# Patient Record
Sex: Male | Born: 1970 | Race: Black or African American | Hispanic: No | Smoking: Former smoker
Health system: Southern US, Community
[De-identification: ages and names within clinical notes are randomized; demographics above are authoritative.]

## PROBLEM LIST (undated history)

## (undated) DIAGNOSIS — I714 Abdominal aortic aneurysm, without rupture, unspecified: Secondary | ICD-10-CM

## (undated) DIAGNOSIS — G510 Bell's palsy: Secondary | ICD-10-CM

## (undated) DIAGNOSIS — K219 Gastro-esophageal reflux disease without esophagitis: Secondary | ICD-10-CM

## (undated) HISTORY — DX: Abdominal aortic aneurysm, without rupture, unspecified: I71.40

## (undated) HISTORY — DX: Bell's palsy: G51.0

## (undated) HISTORY — DX: Abdominal aortic aneurysm, without rupture: I71.4

---

## 2009-03-31 ENCOUNTER — Emergency Department (HOSPITAL_COMMUNITY): Admission: EM | Admit: 2009-03-31 | Discharge: 2009-04-01 | Payer: Self-pay | Admitting: Emergency Medicine

## 2009-04-08 ENCOUNTER — Emergency Department (HOSPITAL_COMMUNITY): Admission: EM | Admit: 2009-04-08 | Discharge: 2009-04-08 | Payer: Self-pay | Admitting: Emergency Medicine

## 2010-05-15 ENCOUNTER — Emergency Department (HOSPITAL_COMMUNITY): Admission: EM | Admit: 2010-05-15 | Discharge: 2010-05-15 | Payer: Self-pay | Admitting: Emergency Medicine

## 2015-03-30 ENCOUNTER — Ambulatory Visit: Payer: Self-pay | Admitting: Family

## 2015-03-30 DIAGNOSIS — Z0289 Encounter for other administrative examinations: Secondary | ICD-10-CM

## 2015-04-27 ENCOUNTER — Encounter: Payer: Self-pay | Admitting: Family

## 2015-04-27 ENCOUNTER — Telehealth: Payer: Self-pay | Admitting: Family

## 2015-04-27 ENCOUNTER — Other Ambulatory Visit (INDEPENDENT_AMBULATORY_CARE_PROVIDER_SITE_OTHER): Payer: BLUE CROSS/BLUE SHIELD

## 2015-04-27 ENCOUNTER — Ambulatory Visit (INDEPENDENT_AMBULATORY_CARE_PROVIDER_SITE_OTHER): Payer: BLUE CROSS/BLUE SHIELD | Admitting: Family

## 2015-04-27 VITALS — BP 120/84 | HR 75 | Temp 98.4°F | Resp 18 | Ht 72.0 in | Wt 218.8 lb

## 2015-04-27 DIAGNOSIS — Z0000 Encounter for general adult medical examination without abnormal findings: Secondary | ICD-10-CM | POA: Diagnosis not present

## 2015-04-27 DIAGNOSIS — D72819 Decreased white blood cell count, unspecified: Secondary | ICD-10-CM

## 2015-04-27 DIAGNOSIS — Z23 Encounter for immunization: Secondary | ICD-10-CM | POA: Diagnosis not present

## 2015-04-27 LAB — TSH: TSH: 0.7 u[IU]/mL (ref 0.35–4.50)

## 2015-04-27 LAB — COMPREHENSIVE METABOLIC PANEL
ALBUMIN: 4.1 g/dL (ref 3.5–5.2)
ALT: 15 U/L (ref 0–53)
AST: 13 U/L (ref 0–37)
Alkaline Phosphatase: 83 U/L (ref 39–117)
BUN: 11 mg/dL (ref 6–23)
CALCIUM: 9.5 mg/dL (ref 8.4–10.5)
CHLORIDE: 103 meq/L (ref 96–112)
CO2: 27 meq/L (ref 19–32)
Creatinine, Ser: 1.11 mg/dL (ref 0.40–1.50)
GFR: 92.66 mL/min (ref >60.00)
Glucose, Bld: 93 mg/dL (ref 70–99)
POTASSIUM: 3.9 meq/L (ref 3.5–5.1)
Sodium: 138 mEq/L (ref 135–145)
Total Bilirubin: 0.4 mg/dL (ref 0.2–1.2)
Total Protein: 7.5 g/dL (ref 6.0–8.3)

## 2015-04-27 LAB — HIV ANTIBODY (ROUTINE TESTING W REFLEX): HIV: NONREACTIVE

## 2015-04-27 LAB — CBC
HEMATOCRIT: 44.3 % (ref 39.0–52.0)
Hemoglobin: 14.4 g/dL (ref 13.0–17.0)
MCHC: 32.5 g/dL (ref 30.0–36.0)
MCV: 84.5 fl (ref 78.0–100.0)
PLATELETS: 132 10*3/uL — AB (ref 150.0–400.0)
RBC: 5.24 Mil/uL (ref 4.22–5.81)
RDW: 15.7 % — ABNORMAL HIGH (ref 11.5–15.5)
WBC: 3.1 10*3/uL — ABNORMAL LOW (ref 4.0–10.5)

## 2015-04-27 LAB — LIPID PANEL
CHOLESTEROL: 217 mg/dL — AB (ref 0–200)
HDL: 68.3 mg/dL (ref >39.00)
LDL CALC: 123 mg/dL — AB (ref 0–99)
NonHDL: 148.65
TRIGLYCERIDES: 129 mg/dL (ref 0.0–149.0)
Total CHOL/HDL Ratio: 3
VLDL: 25.8 mg/dL (ref 0.0–40.0)

## 2015-04-27 NOTE — Progress Notes (Signed)
Pre visit review using our clinic review tool, if applicable. No additional management support is needed unless otherwise documented below in the visit note.  Refused flu wants tetanus

## 2015-04-27 NOTE — Progress Notes (Signed)
Subjective:    Patient ID: Andre Morris, male    DOB: 05/30/1971, 44 y.o.   MRN: [id number]  Chief Complaint  Patient presents with  . Establish Care    CPE, fasting    HPI:  Andre Morris is a 44 y.o. male who presents today for an annual wellness visit.   1) Health Maintenance -   Diet - Averages about 1-3 meals per day consisting of a variety of vegetables, chicken, and fruit; 1-2 cups of caffeine every few days.   Exercise - No structured exercise.    2) Preventative Exams / Immunizations:  Dental -- Up to date  Vision -- Due for exam   Health Maintenance  Topic Date Due  . HIV Screening  07/31/1986  . TETANUS/TDAP  07/31/1990  . INFLUENZA VACCINE  03/11/2016 (Originally 01/26/2015)    Immunization History  Administered Date(s) Administered  . Td 04/27/2015    No Known Allergies   No outpatient prescriptions prior to visit.   No facility-administered medications prior to visit.     History reviewed. No pertinent past medical history.   History reviewed. No pertinent past surgical history.   Family History  Problem Relation Age of Onset  . Hypertension Mother   . Lung cancer Father   . Lung cancer Maternal Grandmother   . Heart disease Maternal Grandmother   . Hypertension Maternal Grandmother   . Healthy Maternal Grandfather   . Healthy Paternal Grandmother   . Healthy Paternal Grandfather      Social History   Social History  . Marital Status: Single    Spouse Name: N/A  . Number of Children: 4  . Years of Education: 12   Occupational History  . OTR Truck Driver    Social History Main Topics  . Smoking status: Current Every Day Smoker -- 0.50 packs/day for 20 years    Types: Cigarettes  . Smokeless tobacco: Never Used  . Alcohol Use: 0.0 oz/week    0 Standard drinks or equivalent per week     Comment: Occasionanally   . Drug Use: No  . Sexual Activity: Not on file   Other Topics Concern  . Not on file    Social History Narrative   Fun: Go-carts, anything adventurous.    Denies religious beliefs effecting health care.       Review of Systems  Constitutional: Denies fever, chills, fatigue, or significant weight gain/loss. HENT: Head: Denies headache or neck pain Ears: Denies changes in hearing, ringing in ears, earache, drainage Nose: Denies discharge, stuffiness, itching, nosebleed, sinus pain Throat: Denies sore throat, hoarseness, dry mouth, sores, thrush Eyes: Denies loss/changes in vision, pain, redness, blurry/double vision, flashing lights Cardiovascular: Denies chest pain/discomfort, tightness, palpitations, shortness of breath with activity, difficulty lying down, swelling, sudden awakening with shortness of breath Respiratory: Denies shortness of breath, cough, sputum production, wheezing Gastrointestinal: Denies dysphasia, heartburn, change in appetite, nausea, change in bowel habits, rectal bleeding, constipation, diarrhea, yellow skin or eyes Genitourinary: Denies frequency, urgency, burning/pain, blood in urine, incontinence, change in urinary strength. Musculoskeletal: Denies muscle/joint pain, stiffness, back pain, redness or swelling of joints, trauma Skin: Denies rashes, lumps, itching, dryness, color changes, or hair/nail changes Neurological: Denies dizziness, fainting, seizures, weakness, numbness, tingling, tremor Psychiatric - Denies nervousness, stress, depression or memory loss Endocrine: Denies heat or cold intolerance, sweating, frequent urination, excessive thirst, changes in appetite Hematologic: Denies ease of bruising or bleeding     Objective:     BP 120/84  mmHg  Pulse 75  Temp(Src) 98.4 F (36.9 C) (Oral)  Resp 18  Ht 6' (1.829 m)  Wt 218 lb 12.8 oz (99.247 kg)  BMI 29.67 kg/m2  SpO2 98% Nursing note and vital signs reviewed.  Physical Exam  Constitutional: He is oriented to person, place, and time. He appears well-developed and  well-nourished.  HENT:  Head: Normocephalic.  Right Ear: Hearing, tympanic membrane, external ear and ear canal normal.  Left Ear: Hearing, tympanic membrane, external ear and ear canal normal.  Nose: Nose normal.  Mouth/Throat: Uvula is midline, oropharynx is clear and moist and mucous membranes are normal.  Eyes: Conjunctivae and EOM are normal. Pupils are equal, round, and reactive to light.  Neck: Neck supple. No JVD present. No tracheal deviation present. No thyromegaly present.  Cardiovascular: Normal rate, regular rhythm, normal heart sounds and intact distal pulses.   Pulmonary/Chest: Effort normal. He has wheezes.  Abdominal: Soft. Bowel sounds are normal. He exhibits no distension and no mass. There is no tenderness. There is no rebound and no guarding.  Musculoskeletal: Normal range of motion. He exhibits no edema or tenderness.  Lymphadenopathy:    He has no cervical adenopathy.  Neurological: He is alert and oriented to person, place, and time. He has normal reflexes. No cranial nerve deficit. He exhibits normal muscle tone. Coordination normal.  Skin: Skin is warm and dry.  Psychiatric: He has a normal mood and affect. His behavior is normal. Judgment and thought content normal.       Assessment & Plan:   Problem List Items Addressed This Visit      Other   Routine general medical examination at a health care facility - Primary    1) Anticipatory Guidance: Discussed importance of wearing a seatbelt while driving and not texting while driving; changing batteries in smoke detector at least once annually; wearing suntan lotion when outside; eating a balanced and moderate diet; getting physical activity at least 30 minutes per day.  2) Immunizations / Screenings / Labs:  Tetanus updated today. Declines flu shot. All other immunizations are up-to-date per recommendations. Due for a vision screen. This will be scheduled independently. Obtain HIV screening per patient request.  All other screenings are up-to-date per recommendations. Obtain CBC, CMET, Lipid profile and TSH.   Overall well exam with risk factors for cardiovascular disease including overweight, tobacco use, and sedentary lifestyle. Establish weight loss: Approximately 5-10% current body weight through increased physical activity to 30 minutes most days a week and nutrient density of foods and eating a moderate/variety meal plan. Not ready to quit smoking at this time. Smoking cessation information provided for his review. Follow-up prevention exam in 1 year. Follow-up office visit pending blood work.       Relevant Orders   CBC   Comprehensive metabolic panel   TSH   Lipid panel   HIV antibody    Other Visit Diagnoses    Need for prophylactic vaccination with tetanus-diphtheria (TD)        Relevant Orders    Td vaccine greater than or equal to 7yo preservative free IM (Completed)

## 2015-04-27 NOTE — Assessment & Plan Note (Signed)
1) Anticipatory Guidance: Discussed importance of wearing a seatbelt while driving and not texting while driving; changing batteries in smoke detector at least once annually; wearing suntan lotion when outside; eating a balanced and moderate diet; getting physical activity at least 30 minutes per day.  2) Immunizations / Screenings / Labs:  Tetanus updated today. Declines flu shot. All other immunizations are up-to-date per recommendations. Due for a vision screen. This will be scheduled independently. Obtain HIV screening per patient request. All other screenings are up-to-date per recommendations. Obtain CBC, CMET, Lipid profile and TSH.   Overall well exam with risk factors for cardiovascular disease including overweight, tobacco use, and sedentary lifestyle. Establish weight loss: Approximately 5-10% current body weight through increased physical activity to 30 minutes most days a week and nutrient density of foods and eating a moderate/variety meal plan. Not ready to quit smoking at this time. Smoking cessation information provided for his review. Follow-up prevention exam in 1 year. Follow-up office visit pending blood work.

## 2015-04-27 NOTE — Telephone Encounter (Signed)
Please inform patient that his HIV was negative and his cholesterol, thyroid function, kidney function and electrolytes are within the normal limits. His white/red blood cells had a few values that were slightly decreased so I'd like for him to repeat the blood work sometime in the next week as this is likely nothing to be concern about. No fasting is needed for this appointment.

## 2015-04-27 NOTE — Patient Instructions (Signed)
Thank you for choosing [facility].  Summary/Instructions:  Your prescription(s) have been submitted to your pharmacy or been printed and provided for you. Please take as directed and contact our office if you believe you are having problem(s) with the medication(s) or have any questions.  Please stop by the lab on the basement level of the building for your blood work. Your results will be released to [place] (or called to you) after review, usually within 72 hours after test completion. If any changes need to be made, you will be notified at that same time.  Health Maintenance, Male A healthy lifestyle and preventative care can promote health and wellness.  Maintain regular health, dental, and eye exams.  Eat a healthy diet. Foods like vegetables, fruits, whole grains, low-fat dairy products, and lean protein foods contain the nutrients you need and are low in calories. Decrease your intake of foods high in solid fats, added sugars, and salt. Get information about a proper diet from your health care provider, if necessary.  Regular physical exercise is one of the most important things you can do for your health. Most adults should get at least 150 minutes of moderate-intensity exercise (any activity that increases your heart rate and causes you to sweat) each week. In addition, most adults need muscle-strengthening exercises on 2 or more days a week.   Maintain a healthy weight. The body mass index (BMI) is a screening tool to identify possible weight problems. It provides an estimate of body fat based on height and weight. Your health care provider can find your BMI and can help you achieve or maintain a healthy weight. For males 20 years and older:  A BMI below 18.5 is considered underweight.  A BMI of 18.5 to 24.9 is normal.  A BMI of 25 to 29.9 is considered overweight.  A BMI of 30 and above is considered obese.  Maintain normal blood lipids and cholesterol by exercising and  minimizing your intake of saturated fat. Eat a balanced diet with plenty of fruits and vegetables. Blood tests for lipids and cholesterol should begin at age 20 and be repeated every 5 years. If your lipid or cholesterol levels are high, you are over age 39, or you are at high risk for heart disease, you may need your cholesterol levels checked more frequently.Ongoing high lipid and cholesterol levels should be treated with medicines if diet and exercise are not working.  If you smoke, find out from your health care provider how to quit. If you do not use tobacco, do not start.  Lung cancer screening is recommended for adults aged 34-80 years who are at high risk for developing lung cancer because of a history of smoking. A yearly low-dose CT scan of the lungs is recommended for people who have at least a 30-pack-year history of smoking and are current smokers or have quit within the past 15 years. A pack year of smoking is smoking an average of 1 pack of cigarettes a day for 1 year (for example, a 30-pack-year history of smoking could mean smoking 1 pack a day for 30 years or 2 packs a day for 15 years). Yearly screening should continue until the smoker has stopped smoking for at least 15 years. Yearly screening should be stopped for people who develop a health problem that would prevent them from having lung cancer treatment.  If you choose to drink alcohol, do not have more than 2 drinks per day. One drink is considered to be  12 oz (360 mL) of beer, 5 oz (150 mL) of wine, or 1.5 oz (45 mL) of liquor.  Avoid the use of street drugs. Do not share needles with anyone. Ask for help if you need support or instructions about stopping the use of drugs.  High blood pressure causes heart disease and increases the risk of stroke. High blood pressure is more likely to develop in:  People who have blood pressure in the end of the normal range (100-139/85-89 mm Hg).  People who are overweight or  obese.  People who are African American.  If you are 51-40 years of age, have your blood pressure checked every 3-5 years. If you are 77 years of age or older, have your blood pressure checked every year. You should have your blood pressure measured twice--once when you are at a hospital or clinic, and once when you are not at a hospital or clinic. Record the average of the two measurements. To check your blood pressure when you are not at a hospital or clinic, you can use:  An automated blood pressure machine at a pharmacy.  A home blood pressure monitor.  If you are 35-45 years old, ask your health care provider if you should take aspirin to prevent heart disease.  Diabetes screening involves taking a blood sample to check your fasting blood sugar level. This should be done once every 3 years after age 90 if you are at a normal weight and without risk factors for diabetes. Testing should be considered at a younger age or be carried out more frequently if you are overweight and have at least 1 risk factor for diabetes.  Colorectal cancer can be detected and often prevented. Most routine colorectal cancer screening begins at the age of 57 and continues through age 72. However, your health care provider may recommend screening at an earlier age if you have risk factors for colon cancer. On a yearly basis, your health care provider may provide home test kits to check for hidden blood in the stool. A small camera at the end of a tube may be used to directly examine the colon (sigmoidoscopy or colonoscopy) to detect the earliest forms of colorectal cancer. Talk to your health care provider about this at age 54 when routine screening begins. A direct exam of the colon should be repeated every 5-10 years through age 4, unless early forms of precancerous polyps or small growths are found.  People who are at an increased risk for hepatitis B should be screened for this virus. You are considered at high risk  for hepatitis B if:  You were born in a country where hepatitis B occurs often. Talk with your health care provider about which countries are considered high risk.  Your parents were born in a high-risk country and you have not received a shot to protect against hepatitis B (hepatitis B vaccine).  You have HIV or AIDS.  You use needles to inject street drugs.  You live with, or have sex with, someone who has hepatitis B.  You are a man who has sex with other men (MSM).  You get hemodialysis treatment.  You take certain medicines for conditions like cancer, organ transplantation, and autoimmune conditions.  Hepatitis C blood testing is recommended for all people born from [birth year] through 1964 and any individual with known risk factors for hepatitis C.  Healthy men should no longer receive prostate-specific antigen (PSA) blood tests as part of routine cancer screening. Talk to your  health care provider about prostate cancer screening.  Testicular cancer screening is not recommended for adolescents or adult males who have no symptoms. Screening includes self-exam, a health care provider exam, and other screening tests. Consult with your health care provider about any symptoms you have or any concerns you have about testicular cancer.  Practice safe sex. Use condoms and avoid high-risk sexual practices to reduce the spread of sexually transmitted infections (STIs).  You should be screened for STIs, including gonorrhea and chlamydia if:  You are sexually active and are younger than 24 years.  You are older than 24 years, and your health care provider tells you that you are at risk for this type of infection.  Your sexual activity has changed since you were last screened, and you are at an increased risk for chlamydia or gonorrhea. Ask your health care provider if you are at risk.  If you are at risk of being infected with HIV, it is recommended that you take a prescription medicine daily to  prevent HIV infection. This is called pre-exposure prophylaxis (PrEP). You are considered at risk if:  You are a man who has sex with other men (MSM).  You are a heterosexual man who is sexually active with multiple partners.  You take drugs by injection.  You are sexually active with a partner who has HIV.  Talk with your health care provider about whether you are at high risk of being infected with HIV. If you choose to begin PrEP, you should first be tested for HIV. You should then be tested every 3 months for as long as you are taking PrEP.  Use sunscreen. Apply sunscreen liberally and repeatedly throughout the day. You should seek shade when your shadow is shorter than you. Protect yourself by wearing long sleeves, pants, a wide-brimmed hat, and sunglasses year round whenever you are outdoors.  Tell your health care provider of new moles or changes in moles, especially if there is a change in shape or color. Also, tell your health care provider if a mole is larger than the size of a pencil eraser.  A one-time screening for abdominal aortic aneurysm (AAA) and surgical repair of large AAAs by ultrasound is recommended for men aged 65-75 years who are current or former smokers.  Stay current with your vaccines (immunizations).   This information is not intended to replace advice given to you by your health care provider. Make sure you discuss any questions you have with your health care provider.   Document Released: 12/10/2007 Document Revised: 07/04/2014 Document Reviewed: 11/08/2010 Elsevier Interactive Patient Education 2016 [employer]  Smoking Cessation, Tips for Success If you are ready to quit smoking, congratulations! You have chosen to help yourself be healthier. Cigarettes bring nicotine, tar, carbon monoxide, and other irritants into your body. Your lungs, heart, and blood vessels will be able to work better without these poisons. There are many different ways to quit  smoking. Nicotine gum, nicotine patches, a nicotine inhaler, or nicotine nasal spray can help with physical craving. Hypnosis, support groups, and medicines help break the habit of smoking. WHAT THINGS CAN I DO TO MAKE QUITTING EASIER?  Here are some tips to help you quit for good:  Pick a date when you will quit smoking completely. Tell all of your friends and family about your plan to quit on that date.  Do not try to slowly cut down on the number of cigarettes you are smoking. Pick a quit date and quit  smoking completely starting on that day.  Throw away all cigarettes.   Clean and remove all ashtrays from your home, work, and car.  On a card, write down your reasons for quitting. Carry the card with you and read it when you get the urge to smoke.  Cleanse your body of nicotine. Drink enough water and fluids to keep your urine clear or pale yellow. Do this after quitting to flush the nicotine from your body.  Learn to predict your moods. Do not let a bad situation be your excuse to have a cigarette. Some situations in your life might tempt you into wanting a cigarette.  Never have "just one" cigarette. It leads to wanting another and another. Remind yourself of your decision to quit.  Change habits associated with smoking. If you smoked while driving or when feeling stressed, try other activities to replace smoking. Stand up when drinking your coffee. Brush your teeth after eating. Sit in a different chair when you read the paper. Avoid alcohol while trying to quit, and try to drink fewer caffeinated beverages. Alcohol and caffeine may urge you to smoke.  Avoid foods and drinks that can trigger a desire to smoke, such as sugary or spicy foods and alcohol.  Ask people who smoke not to smoke around you.  Have something planned to do right after eating or having a cup of coffee. For example, plan to take a walk or exercise.  Try a relaxation exercise to calm you down and decrease your  stress. Remember, you may be tense and nervous for the first 2 weeks after you quit, but this will pass.  Find new activities to keep your hands busy. Play with a pen, coin, or rubber band. Doodle or draw things on paper.  Brush your teeth right after eating. This will help cut down on the craving for the taste of tobacco after meals. You can also try mouthwash.   Use oral substitutes in place of cigarettes. Try using lemon drops, carrots, cinnamon sticks, or chewing gum. Keep them handy so they are available when you have the urge to smoke.  When you have the urge to smoke, try deep breathing.  Designate your home as a nonsmoking area.  If you are a heavy smoker, ask your health care provider about a prescription for nicotine chewing gum. It can ease your withdrawal from nicotine.  Reward yourself. Set aside the cigarette money you save and buy yourself something nice.  Look for support from others. Join a support group or smoking cessation program. Ask someone at home or at work to help you with your plan to quit smoking.  Always ask yourself, "Do I need this cigarette or is this just a reflex?" Tell yourself, "Today, I choose not to smoke," or "I do not want to smoke." You are reminding yourself of your decision to quit.  Do not replace cigarette smoking with electronic cigarettes (commonly called e-cigarettes). The safety of e-cigarettes is unknown, and some may contain harmful chemicals.  If you relapse, do not give up! Plan ahead and think about what you will do the next time you get the urge to smoke. HOW WILL I FEEL WHEN I QUIT SMOKING? You may have symptoms of withdrawal because your body is used to nicotine (the addictive substance in cigarettes). You may crave cigarettes, be irritable, feel very hungry, cough often, get headaches, or have difficulty concentrating. The withdrawal symptoms are only temporary. They are strongest when you first quit but will  go away within 10-14 days.  When withdrawal symptoms occur, stay in control. Think about your reasons for quitting. Remind yourself that these are signs that your body is healing and getting used to being without cigarettes. Remember that withdrawal symptoms are easier to treat than the major diseases that smoking can cause.  Even after the withdrawal is over, expect periodic urges to smoke. However, these cravings are generally short lived and will go away whether you smoke or not. Do not smoke! WHAT RESOURCES ARE AVAILABLE TO HELP ME QUIT SMOKING? Your health care provider can direct you to community resources or hospitals for support, which may include:  Group support.  Education.  Hypnosis.  Therapy.   This information is not intended to replace advice given to you by your health care provider. Make sure you discuss any questions you have with your health care provider.   Document Released: 03/11/2004 Document Revised: 07/04/2014 Document Reviewed: 11/29/2012 Elsevier Interactive Patient Education 2016 [employer].   Bupropion sustained-release tablets (smoking cessation) What is this medicine? BUPROPION (byoo PROE pee on) is used to help people quit smoking. This medicine may be used for other purposes; ask your health care provider or pharmacist if you have questions. What should I tell my health care provider before I take this medicine? They need to know if you have any of these conditions: -an eating disorder, such as anorexia or bulimia -bipolar disorder or psychosis -diabetes or high blood sugar, treated with medication -glaucoma -head injury or brain tumor -heart disease, previous heart attack, or irregular heart beat -high blood pressure -kidney or liver disease -seizures -suicidal thoughts or a previous suicide attempt -Tourette's syndrome -weight loss -an unusual or allergic reaction to bupropion, other medicines, foods, dyes, or preservatives -breast-feeding -pregnant or trying to become  pregnant How should I use this medicine? Take this medicine by mouth with a glass of water. Follow the directions on the prescription label. You can take it with or without food. If it upsets your stomach, take it with food. Do not cut, crush or chew this medicine. Take your medicine at regular intervals. If you take this medicine more than once a day, take your second dose at least 8 hours after you take your first dose. To limit difficulty in sleeping, avoid taking this medicine at bedtime. Do not take your medicine more often than directed. Do not stop taking this medicine suddenly except upon the advice of your doctor. Stopping this medicine too quickly may cause serious side effects. A special MedGuide will be given to you by the pharmacist with each prescription and refill. Be sure to read this information carefully each time. Talk to your pediatrician regarding the use of this medicine in children. Special care may be needed. Overdosage: If you think you have taken too much of this medicine contact a poison control center or emergency room at once. NOTE: This medicine is only for you. Do not share this medicine with others. What if I miss a dose? If you miss a dose, skip the missed dose and take your next tablet at the regular time. There should be at least 8 hours between doses. Do not take double or extra doses. What may interact with this medicine? Do not take this medicine with any of the following medications: -linezolid -MAOIs like Azilect, Carbex, Eldepryl, Marplan, Nardil, and Parnate -methylene blue (injected into a vein) -other medicines that contain bupropion like Wellbutrin This medicine may also interact with the following medications: -alcohol -certain  medicines for anxiety or sleep -certain medicines for blood pressure like metoprolol, propranolol -certain medicines for depression or psychotic disturbances -certain medicines for HIV or AIDS like efavirenz, lopinavir,  nelfinavir, ritonavir -certain medicines for irregular heart beat like propafenone, flecainide -certain medicines for Parkinson's disease like amantadine, levodopa -certain medicines for seizures like carbamazepine, phenytoin, phenobarbital -cimetidine -clopidogrel -cyclophosphamide -furazolidone -isoniazid -nicotine -orphenadrine -procarbazine -steroid medicines like prednisone or cortisone -stimulant medicines for attention disorders, weight loss, or to stay awake -tamoxifen -theophylline -thiotepa -ticlopidine -tramadol -warfarin This list may not describe all possible interactions. Give your health care provider a list of all the medicines, herbs, non-prescription drugs, or dietary supplements you use. Also tell them if you smoke, drink alcohol, or use illegal drugs. Some items may interact with your medicine. What should I watch for while using this medicine? Visit your doctor or health care professional for regular checks on your progress. This medicine should be used together with a patient support program. It is important to participate in a behavioral program, counseling, or other support program that is recommended by your health care professional. Patients and their families should watch out for new or worsening thoughts of suicide or depression. Also watch out for sudden changes in feelings such as feeling anxious, agitated, panicky, irritable, hostile, aggressive, impulsive, severely restless, overly excited and hyperactive, or not being able to sleep. If this happens, especially at the beginning of treatment or after a change in dose, call your health care professional. Avoid alcoholic drinks while taking this medicine. Drinking excessive alcoholic beverages, using sleeping or anxiety medicines, or quickly stopping the use of these agents while taking this medicine may increase your risk for a seizure. Do not drive or use heavy machinery until you know how this medicine affects  you. This medicine can impair your ability to perform these tasks. Do not take this medicine close to bedtime. It may prevent you from sleeping. Your mouth may get dry. Chewing sugarless gum or sucking hard candy, and drinking plenty of water may help. Contact your doctor if the problem does not go away or is severe. Do not use nicotine patches or chewing gum without the advice of your doctor or health care professional while taking this medicine. You may need to have your blood pressure taken regularly if your doctor recommends that you use both nicotine and this medicine together. What side effects may I notice from receiving this medicine? Side effects that you should report to your doctor or health care professional as soon as possible: -allergic reactions like skin rash, itching or hives, swelling of the face, lips, or tongue -breathing problems -changes in vision -confusion -fast or irregular heartbeat -hallucinations -increased blood pressure -redness, blistering, peeling or loosening of the skin, including inside the mouth -seizures -suicidal thoughts or other mood changes -unusually weak or tired -vomiting Side effects that usually do not require medical attention (report to your doctor or health care professional if they continue or are bothersome): -change in sex drive or performance -constipation -headache -loss of appetite -nausea -tremors -weight loss This list may not describe all possible side effects. Call your doctor for medical advice about side effects. You may report side effects to FDA at 1-800-FDA-1088. Where should I keep my medicine? Keep out of the reach of children. Store at room temperature between 20 and 25 degrees C (68 and 77 degrees F). Protect from light. Keep container tightly closed. Throw away any unused medicine after the expiration date. NOTE: This sheet is  a summary. It may not cover all possible information. If you have questions about this  medicine, talk to your doctor, pharmacist, or health care provider.    2016, Elsevier/Gold Standard. (2013-02-08 10:55:10)  Varenicline oral tablets What is this medicine? VARENICLINE (var EN i kleen) is used to help people quit smoking. It can reduce the symptoms caused by stopping smoking. It is used with a patient support program recommended by your physician. This medicine may be used for other purposes; ask your health care provider or pharmacist if you have questions. What should I tell my health care provider before I take this medicine? They need to know if you have any of these conditions: -bipolar disorder, depression, schizophrenia or other mental illness -heart disease -if you often drink alcohol -kidney disease -peripheral vascular disease -seizures -stroke -suicidal thoughts, plans, or attempt; a previous suicide attempt by you or a family member -an unusual or allergic reaction to varenicline, other medicines, foods, dyes, or preservatives -pregnant or trying to get pregnant -breast-feeding How should I use this medicine? Take this medicine by mouth after eating. Take with a full glass of water. Follow the directions on the prescription label. Take your doses at regular intervals. Do not take your medicine more often than directed. There are 3 ways you can use this medicine to help you quit smoking; talk to your health care professional to decide which plan is right for you: 1) you can choose a quit date and start this medicine 1 week before the quit date, or, 2) you can start taking this medicine before you choose a quit date, and then pick a quit date between day 8 and 35 days of treatment, or, 3) if you are not sure that you are able or willing to quit smoking right away, start taking this medicine and slowly decrease the amount you smoke as directed by your health care professional with the goal of being cigarette-free by week 12 of treatment. Stick to your plan; ask  about support groups or other ways to help you remain cigarette-free. If you are motivated to quit smoking and did not succeed during a previous attempt with this medicine for reasons other than side effects, or if you returned to smoking after this treatment, speak with your health care professional about whether another course of this medicine may be right for you. A special MedGuide will be given to you by the pharmacist with each prescription and refill. Be sure to read this information carefully each time. Talk to your pediatrician regarding the use of this medicine in children. This medicine is not approved for use in children. Overdosage: If you think you have taken too much of this medicine contact a poison control center or emergency room at once. NOTE: This medicine is only for you. Do not share this medicine with others. What if I miss a dose? If you miss a dose, take it as soon as you can. If it is almost time for your next dose, take only that dose. Do not take double or extra doses. What may interact with this medicine? -alcohol or any product that contains alcohol -insulin -other stop smoking aids -theophylline -warfarin This list may not describe all possible interactions. Give your health care provider a list of all the medicines, herbs, non-prescription drugs, or dietary supplements you use. Also tell them if you smoke, drink alcohol, or use illegal drugs. Some items may interact with your medicine. What should I watch for while using this medicine?  Visit your doctor or health care professional for regular check ups. Ask for ongoing advice and encouragement from your doctor or healthcare professional, friends, and family to help you quit. If you smoke while on this medication, quit again Your mouth may get dry. Chewing sugarless gum or sucking hard candy, and drinking plenty of water may help. Contact your doctor if the problem does not go away or is severe. You may get drowsy or  dizzy. Do not drive, use machinery, or do anything that needs mental alertness until you know how this medicine affects you. Do not stand or sit up quickly, especially if you are an older patient. This reduces the risk of dizzy or fainting spells. Sleepwalking can happen during treatment with this medicine, and can sometimes lead to behavior that is harmful to you, other people, or property. Stop taking this medicine and tell your doctor if you start sleepwalking or have other unusual sleep-related activity. Decrease the amount of alcoholic beverages that you drink during treatment with this medicine until you know if this medicine affects your ability to tolerate alcohol. Some people have experienced increased drunkenness (intoxication), unusual or sometimes aggressive behavior, or no memory of things that have happened (amnesia) during treatment with this medicine. The use of this medicine may increase the chance of suicidal thoughts or actions. Pay special attention to how you are responding while on this medicine. Any worsening of mood, or thoughts of suicide or dying should be reported to your health care professional right away. What side effects may I notice from receiving this medicine? Side effects that you should report to your doctor or health care professional as soon as possible: -allergic reactions like skin rash, itching or hives, swelling of the face, lips, tongue, or throat -acting aggressive, being angry or violent, or acting on dangerous impulses -breathing problems -changes in vision -chest pain or chest tightness -confusion, trouble speaking or understanding -new or worsening depression, anxiety, or panic attacks -extreme increase in activity and talking (mania) -fast, irregular heartbeat -feeling faint or lightheaded, falls -fever -pain in legs when walking -problems with balance, talking, walking -redness, blistering, peeling or loosening of the skin, including inside the  mouth -ringing in ears -seeing or hearing things that aren't there (hallucinations) -seizures -sleepwalking -sudden numbness or weakness of the face, arm or leg -thoughts about suicide or dying, or attempts to commit suicide -trouble passing urine or change in the amount of urine -unusual bleeding or bruising -unusually weak or tired Side effects that usually do not require medical attention (report to your doctor or health care professional if they continue or are bothersome): -constipation -headache -nausea, vomiting -strange dreams -stomach gas -trouble sleeping This list may not describe all possible side effects. Call your doctor for medical advice about side effects. You may report side effects to FDA at 1-800-FDA-1088. Where should I keep my medicine? Keep out of the reach of children. Store at room temperature between 15 and 30 degrees C (59 and 86 degrees F). Throw away any unused medicine after the expiration date. NOTE: This sheet is a summary. It may not cover all possible information. If you have questions about this medicine, talk to your doctor, pharmacist, or health care provider.    2016, Elsevier/Gold Standard. (2015-02-26 16:14:23)

## 2015-04-30 ENCOUNTER — Telehealth: Payer: Self-pay | Admitting: Family

## 2015-04-30 NOTE — Telephone Encounter (Signed)
Patient is calling for lab results. i do not see that interpretation has been entered. Please notify patient,

## 2015-04-30 NOTE — Telephone Encounter (Signed)
Pt aware of results 

## 2016-04-27 DIAGNOSIS — G510 Bell's palsy: Secondary | ICD-10-CM

## 2016-04-27 HISTORY — DX: Bell's palsy: G51.0

## 2016-05-13 ENCOUNTER — Ambulatory Visit (INDEPENDENT_AMBULATORY_CARE_PROVIDER_SITE_OTHER): Payer: BLUE CROSS/BLUE SHIELD | Admitting: Physician Assistant

## 2016-05-13 ENCOUNTER — Telehealth: Payer: Self-pay | Admitting: *Deleted

## 2016-05-13 ENCOUNTER — Ambulatory Visit (HOSPITAL_COMMUNITY)
Admission: RE | Admit: 2016-05-13 | Discharge: 2016-05-13 | Disposition: A | Discharge status: Home / Self Care | Payer: Self-pay | Source: Ambulatory Visit | Attending: Physician Assistant | Admitting: Physician Assistant

## 2016-05-13 VITALS — BP 152/89 | HR 76 | Temp 98.6°F | Resp 17 | Ht 72.0 in | Wt 205.0 lb

## 2016-05-13 DIAGNOSIS — R29810 Facial weakness: Secondary | ICD-10-CM | POA: Insufficient documentation

## 2016-05-13 MED ORDER — VALACYCLOVIR HCL 1 G PO TABS: 1000.0000 mg | ORAL_TABLET | Freq: Three times a day (TID) | ORAL | 0 refills | Status: DC

## 2016-05-13 MED ORDER — PREDNISONE 20 MG PO TABS: 60.0000 mg | ORAL_TABLET | Freq: Every day | ORAL | 0 refills | Status: DC

## 2016-05-13 NOTE — Addendum Note (Signed)
Addended by: Ofilia NeasLARK, Alaster Asfaw L on: 05/13/2016 12:32 PM   Modules accepted: Orders

## 2016-05-13 NOTE — Telephone Encounter (Signed)
Informed patient of CT results and to go to pharmacy to pick up medications. Needs f/u visit in 7 days.

## 2016-05-13 NOTE — Progress Notes (Addendum)
05/13/2016 11:13 AM   DOB: 09/30/1970 / MRN: [id number]  SUBJECTIVE:  Andre Morris is a 45 y.o. male presenting for right sided facial droop. Assoicates right eye dryness and irritation.  Denies any itching, HA.  Denies a history of HTN.  Denies a history of diabetes.  Denies a family history of stroke.  He has not taken any medication today and does not take any medication chronically.   He is a smoker of 20 years at roughly 1 pack per day.  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   He has No Known Allergies.   He  has no past medical history on file.    He  reports that he has been smoking Cigarettes.  He has a 10.00 pack-year smoking history. He has never used smokeless tobacco. He reports that he drinks alcohol. He reports that he does not use drugs. He  has no sexual activity history on file. The patient  has no past surgical history on file.  His family history includes Healthy in his maternal grandfather, paternal grandfather, and paternal grandmother; Heart disease in his maternal grandmother; Hypertension in his maternal grandmother and mother; Lung cancer in his father and maternal grandmother.  Review of Systems  Constitutional: Negative for chills and fever.  HENT: Negative for congestion, ear discharge, ear pain, hearing loss and tinnitus.   Respiratory: Negative for cough.   Cardiovascular: Negative for chest pain.  Genitourinary: Negative for dysuria.  Musculoskeletal: Negative for myalgias.  Skin: Negative for itching and rash.  Neurological: Negative for dizziness, tingling, tremors, sensory change, speech change, focal weakness, seizures, loss of consciousness and headaches.    The problem list and medications were reviewed and updated by myself where necessary and exist elsewhere in the encounter.   OBJECTIVE:  BP (!) 152/89 (BP Location: Left Arm, Patient Position: Sitting, Cuff Size: Large)   Pulse 76   Temp 98.6 F (37 C) (Oral)   Resp 17   Ht 6'  (1.829 m)   Wt 205 lb (93 kg)   SpO2 99%   BMI 27.80 kg/m   Physical Exam  Constitutional: He is oriented to person, place, and time.  Cardiovascular: Normal rate and regular rhythm.   Pulmonary/Chest: Effort normal and breath sounds normal.  Musculoskeletal: Normal range of motion.  Neurological: He is alert and oriented to person, place, and time. He has normal reflexes. He displays normal reflexes. 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 ) is present. He exhibits normal muscle tone. Coordination normal.  Skin: Skin is warm and dry.    No results found for this or any previous visit (from the past 72 hour(s)).  Ct Head Wo Contrast  Result Date: 05/13/2016 CLINICAL DATA:  Acute right-sided facial droop. EXAM: CT HEAD WITHOUT CONTRAST TECHNIQUE: Contiguous axial images were obtained from the base of the skull through the vertex without intravenous contrast. COMPARISON:  None. FINDINGS: Brain: No mass effect or midline shift is noted. Ventricular size is within normal limits. There is no evidence of mass lesion, hemorrhage or acute infarction. Vascular: No abnormality seen. Skull: Bony calvarium appears intact. Sinuses/Orbits: Visualized paranasal sinuses appear normal. Other: None. IMPRESSION: Normal head CT. Electronically Signed   By: Lupita RaiderJames  Green Jr, M.D.   On: 05/13/2016 10:28    ASSESSMENT AND PLAN  Andre Morris was seen today for rt sided loss of movement in face.  Diagnoses and all orders for this visit:  Facial  droop Comments: Presentation consistent with Bell's and neuro largely intact.  Given history of smoking will scan stat.  Will plan to start pred and valtrex pending scan or ED. Orders: -     CT HEAD WO CONTRAST; Future  Other orders -     predniSONE (DELTASONE) 20 MG tablet; Take 3 tablets (60 mg total) by mouth daily with breakfast. -     valACYclovir (VALTREX) 1000 MG tablet; Take 1 tablet (1,000 mg total) by mouth 3 (three)  times daily.    The patient is advised to call or return to clinic if he does not see an improvement in symptoms, or to seek the care of the closest emergency department if he worsens with the above plan.   Deliah BostonMichael Clark, MHS, PA-C [facility] 05/13/2016 11:13 AM   Addendum: Head CT negative.  Will treat for Bells per uptodate reqs.

## 2016-05-13 NOTE — Patient Instructions (Signed)
  Please go directly to [facility] now.  [address], [place], [place] [address]   IF you received an x-ray today, you will receive an invoice from [facility]. Please contact [facility] at [phone] with questions or concerns regarding your invoice.   IF you received labwork today, you will receive an invoice from [facility]. Please contact [facility] at [phone] with questions or concerns regarding your invoice.   Our billing staff will not be able to assist you with questions regarding bills from these companies.  You will be contacted with the lab results as soon as they are available. The fastest way to get your results is to activate your My Chart account. Instructions are located on the last page of this paperwork. If you have not heard from us regarding the results in 2 weeks, please contact this office.

## 2016-05-24 ENCOUNTER — Ambulatory Visit (INDEPENDENT_AMBULATORY_CARE_PROVIDER_SITE_OTHER): Payer: Self-pay | Admitting: Physician Assistant

## 2016-05-24 VITALS — BP 130/78 | HR 72 | Temp 99.0°F | Resp 17 | Ht 72.0 in | Wt 201.0 lb

## 2016-05-24 DIAGNOSIS — R29810 Facial weakness: Secondary | ICD-10-CM

## 2016-05-24 NOTE — Progress Notes (Signed)
   05/24/2016 8:52 AM   DOB: 02/06/1971 / MRN: [id number]  SUBJECTIVE:  Andre Morris is a 45 y.o. male presenting for Bells Palsy follow up.  CT scan was negative at initial presentation.  He has been taking 60 mg of pred daily since and report he is feeling much better now, states that the strength is coming back in his lips and now can drink without spilling.  Denies any right sided eye pain or eye symptoms and has been taking zyztane daily.  He continues to deny any peripheral weakness.   He has No Known Allergies.   He  has no past medical history on file.    He  reports that he has been smoking Cigarettes.  He has a 10.00 pack-year smoking history. He has never used smokeless tobacco. He reports that he drinks alcohol. He reports that he does not use drugs. He  has no sexual activity history on file. The patient  has no past surgical history on file.  His family history includes Healthy in his maternal grandfather, paternal grandfather, and paternal grandmother; Heart disease in his maternal grandmother; Hypertension in his maternal grandmother and mother; Lung cancer in his father and maternal grandmother.  Review of Systems  Constitutional: Negative for chills and fever.  HENT: Negative for congestion, ear discharge, ear pain, hearing loss and tinnitus.   Cardiovascular: Negative for chest pain.  Genitourinary: Negative for dysuria.  Musculoskeletal: Negative for myalgias.  Skin: Negative for itching and rash.  Neurological: Negative for dizziness, tingling, tremors, sensory change, speech change, focal weakness, seizures, loss of consciousness and headaches.    The problem list and medications were reviewed and updated by myself where necessary and exist elsewhere in the encounter.   OBJECTIVE:  BP 130/78 (BP Location: Right Arm, Patient Position: Sitting, Cuff Size: Large)   Pulse 72   Temp 99 F (37.2 C) (Oral)   Resp 17   Ht 6' (1.829 m)   Wt 201 lb (91.2 kg)   SpO2  98%   BMI 27.26 kg/m   Physical Exam  Constitutional: He is oriented to person, place, and time.  Cardiovascular: Normal rate and regular rhythm.   Pulmonary/Chest: Effort normal and breath sounds normal.  Musculoskeletal: Normal range of motion.  Neurological: He is alert and oriented to person, place, and time. He has normal reflexes. He displays normal reflexes. 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 ) is present. He exhibits normal muscle tone. Coordination normal.  Skin: Skin is warm and dry.    No results found for this or any previous visit (from the past 72 hour(s)).  No results found.  ASSESSMENT AND PLAN  Andre Morris was seen today for follow-up.  Diagnoses and all orders for this visit:  Facial droop Comments: He is doing well.  No eye eymptoms.  Advised he complete pred as prescribed.  Advised it may take up to 6 months to regain full function.     The patient is advised to call or return to clinic if he does not see an improvement in symptoms, or to seek the care of the closest emergency department if he worsens with the above plan.   Andre BostonMichael Latrise Morris, MHS, PA-C [facility] 05/24/2016 8:52 AM

## 2017-03-08 ENCOUNTER — Encounter: Payer: Self-pay | Admitting: Family Medicine

## 2017-03-08 ENCOUNTER — Ambulatory Visit (INDEPENDENT_AMBULATORY_CARE_PROVIDER_SITE_OTHER): Payer: BLUE CROSS/BLUE SHIELD | Admitting: Family Medicine

## 2017-03-08 VITALS — BP 127/83 | HR 69 | Temp 97.9°F | Resp 16 | Ht 72.0 in | Wt 199.2 lb

## 2017-03-08 DIAGNOSIS — Z720 Tobacco use: Secondary | ICD-10-CM

## 2017-03-08 DIAGNOSIS — R3915 Urgency of urination: Secondary | ICD-10-CM | POA: Diagnosis not present

## 2017-03-08 DIAGNOSIS — Z131 Encounter for screening for diabetes mellitus: Secondary | ICD-10-CM

## 2017-03-08 DIAGNOSIS — Z113 Encounter for screening for infections with a predominantly sexual mode of transmission: Secondary | ICD-10-CM

## 2017-03-08 DIAGNOSIS — Z0000 Encounter for general adult medical examination without abnormal findings: Secondary | ICD-10-CM

## 2017-03-08 DIAGNOSIS — Z125 Encounter for screening for malignant neoplasm of prostate: Secondary | ICD-10-CM

## 2017-03-08 DIAGNOSIS — Z13220 Encounter for screening for lipoid disorders: Secondary | ICD-10-CM | POA: Diagnosis not present

## 2017-03-08 DIAGNOSIS — R079 Chest pain, unspecified: Secondary | ICD-10-CM | POA: Diagnosis not present

## 2017-03-08 LAB — POCT URINALYSIS DIP (MANUAL ENTRY)
Bilirubin, UA: NEGATIVE
GLUCOSE UA: NEGATIVE mg/dL
Ketones, POC UA: NEGATIVE mg/dL
Leukocytes, UA: NEGATIVE
Nitrite, UA: NEGATIVE
PH UA: 7 (ref 5.0–8.0)
Protein Ur, POC: NEGATIVE mg/dL
RBC UA: NEGATIVE
SPEC GRAV UA: 1.01 (ref 1.010–1.025)
UROBILINOGEN UA: 0.2 U/dL

## 2017-03-08 MED ORDER — VARENICLINE TARTRATE 0.5 MG X 11 & 1 MG X 42 PO MISC: ORAL | 0 refills | Status: DC

## 2017-03-08 NOTE — Patient Instructions (Signed)
     IF you received an x-ray today, you will receive an invoice from [facility]. Please contact [facility] at [phone] with questions or concerns regarding your invoice.   IF you received labwork today, you will receive an invoice from [facility]. Please contact [facility] at [phone] with questions or concerns regarding your invoice.   Our billing staff will not be able to assist you with questions regarding bills from these companies.  You will be contacted with the lab results as soon as they are available. The fastest way to get your results is to activate your My Chart account. Instructions are located on the last page of this paperwork. If you have not heard from us regarding the results in 2 weeks, please contact this office.     

## 2017-03-08 NOTE — Progress Notes (Signed)
Chief Complaint  Patient presents with  . CPE    physical    Subjective:  Andre Morris Frommer is a 46 y.o. male here for a health maintenance visit.  Patient is established pt  Tobacco use Pt has a 28 pack  Year smoking history Denies weight loss, coughing, hemoptysis He has never quit before but has gone 2 days without smoking if he has a cold He also has intermittent chest pains lasting 5 minutes  Patient Active Problem List   Diagnosis Date Noted  . Routine general medical examination at a health care facility 04/27/2015    Past Medical History:  Diagnosis Date  . Bell's palsy 04/2016    History reviewed. No pertinent surgical history.   Outpatient Medications Prior to Visit  Medication Sig Dispense Refill  . predniSONE (DELTASONE) 20 MG tablet Take 3 tablets (60 mg total) by mouth daily with breakfast. (Patient not taking: Reported on 03/08/2017) 42 tablet 0   No facility-administered medications prior to visit.     No Known Allergies   Family History  Problem Relation Age of Onset  . Hypertension Mother   . Lung cancer Father   . Lung cancer Maternal Grandmother   . Heart disease Maternal Grandmother   . Hypertension Maternal Grandmother   . Healthy Maternal Grandfather   . Healthy Paternal Grandmother   . Healthy Paternal Grandfather      Health Habits: Dental Exam: not up to date Eye Exam: up to date Exercise: 0 times/week on average Current exercise activities: none Diet: eats on the road  Social History   Social History  . Marital status: Single    Spouse name: N/A  . Number of children: 4  . Years of education: 12   Occupational History  . OTR Truck Driver    Social History Main Topics  . Smoking status: Current Every Day Smoker    Packs/day: 0.50    Years: 20.00    Types: Cigarettes  . Smokeless tobacco: Never Used  . Alcohol use 0.0 oz/week     Comment: Occasionanally   . Drug use: No  . Sexual activity: Not on file   Other  Topics Concern  . Not on file   Social History Narrative   Fun: Go-carts, anything adventurous.    Denies religious beliefs effecting health care.    History  Alcohol Use  . 0.0 oz/week    Comment: Occasionanally    History  Smoking Status  . Current Every Day Smoker  . Packs/day: 0.50  . Years: 20.00  . Types: Cigarettes  Smokeless Tobacco  . Never Used   History  Drug Use No     Health Maintenance: See under health Maintenance activity for review of completion dates as well. Immunization History  Administered Date(s) Administered  . Td 04/27/2015      Depression Screen-PHQ2/9 [facility] screen [facility] 03/08/2017 05/24/2016  Decreased Interest 0 0  Down, Depressed, Hopeless 0 0  PHQ - 2 Score 0 0    Depression Severity and Treatment Recommendations:  0-4= None  5-9= Mild / Treatment: Support, educate to call if worse; return in one month  10-14= Moderate / Treatment: Support, watchful waiting; Antidepressant or Psycotherapy  15-19= Moderately severe / Treatment: Antidepressant OR Psychotherapy  >= 20 = Major depression, severe / Antidepressant AND Psychotherapy    Review of Systems   Review of Systems  Constitutional: Negative for chills, fever and weight loss.  HENT: Negative for ear pain, hearing loss and  tinnitus.   Eyes: Negative for blurred vision and double vision.  Respiratory: Positive for wheezing. Negative for cough and sputum production.   Cardiovascular: Negative for chest pain and palpitations.  Gastrointestinal: Negative for abdominal pain, nausea and vomiting.  Genitourinary: Positive for frequency and urgency.  Musculoskeletal: Negative for back pain, myalgias and neck pain.  Skin: Negative for itching and rash.  Neurological: Negative for dizziness, tingling and headaches.  Psychiatric/Behavioral: Negative for depression. The patient is not nervous/anxious.     See HPI for ROS as well.    Objective:   Vitals:   03/08/17 1123   BP: 127/83  Pulse: 69  Resp: 16  Temp: 97.9 F (36.6 C)  TempSrc: Oral  SpO2: 96%  Weight: 199 lb 3.2 oz (90.4 kg)  Height: 6' (1.829 m)    Body mass index is 27.02 kg/m.  Physical Exam  Constitutional: He is oriented to person, place, and time. He appears well-developed and well-nourished.  HENT:  Head: Normocephalic and atraumatic.  Right Ear: External ear normal.  Left Ear: External ear normal.  Nose: Nose normal.  Mouth/Throat: Oropharynx is clear and moist.  Eyes: Pupils are equal, round, and reactive to light. Conjunctivae and EOM are normal.  Cardiovascular: Normal rate, regular rhythm and normal heart sounds.   No murmur heard. Pulmonary/Chest: Effort normal and breath sounds normal. No respiratory distress. He has no wheezes.  Abdominal: Soft. Bowel sounds are normal. He exhibits no distension. There is no tenderness.  Musculoskeletal: Normal range of motion. He exhibits no edema.  Neurological: He is alert and oriented to person, place, and time. He has normal reflexes.  Psychiatric: He has a normal mood and affect. His behavior is normal. Judgment and thought content normal.    ECG- mild LVH   Assessment/Plan:   Patient was seen for a health maintenance exam.  Counseled the patient on health maintenance issues. Reviewed her health mainteance schedule and ordered appropriate tests (see orders.) Counseled on regular exercise and weight management. Recommend regular eye exams and dental cleaning.   The following issues were addressed today for health maintenance:   Andre Morris was seen today for cpe.  Diagnoses and all orders for this visit:  Health maintenance examination  Tobacco abuse- discussed smoking cessation Discussed nicotine replacement vs. zyban vs. chantix Risks and benefits of chantix reviewed Pt agreed to chantix -     Comprehensive metabolic panel -     varenicline (CHANTIX STARTING MONTH PAK) 0.5 MG X 11 & 1 MG X 42 tablet; Take one 0.5 mg  tablet by mouth once daily for 3 days, then increase to one 0.5 mg tablet twice daily for 4 days, then increase to one 1 mg tablet twice daily.  Intermittent chest pain- ECG showed mild LVH otherwise normal  -     TSH -     Comprehensive metabolic panel -     EKG 12-Lead  Urinary urgency- no UTI, will screen for diabetes -     Hemoglobin A1c -     POCT urinalysis dipstick  Screening for malignant neoplasm of prostate -     PSA  Screening, lipid -     Lipid panel  Screening for diabetes mellitus -     Hemoglobin A1c  Screen for STD (sexually transmitted disease)- verbal consent give -     GC/Chlamydia Probe Amp -     HIV antibody -     Hepatitis B surface antigen -     RPR  Not enough  blood drawn to check HIV      No Follow-up on file.    Body mass index is 27.02 kg/m.:  Discussed the patient's BMI with patient. The BMI body mass index is 27.02 kg/m.     No future appointments.  Patient Instructions       IF you received an x-ray today, you will receive an invoice from [facility]. Please contact [facility] at [phone] with questions or concerns regarding your invoice.   IF you received labwork today, you will receive an invoice from [facility]. Please contact [facility] at [phone] with questions or concerns regarding your invoice.   Our billing staff will not be able to assist you with questions regarding bills from these companies.  You will be contacted with the lab results as soon as they are available. The fastest way to get your results is to activate your My Chart account. Instructions are located on the last page of this paperwork. If you have not heard from [place] regarding the results in 2 weeks, please contact this office.

## 2017-03-09 LAB — COMPREHENSIVE METABOLIC PANEL
A/G RATIO: 1.9 (ref 1.2–2.2)
ALBUMIN: 5 g/dL (ref 3.5–5.5)
ALT: 17 IU/L (ref 0–44)
AST: 17 IU/L (ref 0–40)
Alkaline Phosphatase: 87 IU/L (ref 39–117)
BILIRUBIN TOTAL: 0.3 mg/dL (ref 0.0–1.2)
BUN / CREAT RATIO: 14 (ref 9–20)
BUN: 13 mg/dL (ref 6–24)
CALCIUM: 9.7 mg/dL (ref 8.7–10.2)
CHLORIDE: 101 mmol/L (ref 96–106)
CO2: 21 mmol/L (ref 20–29)
Creatinine, Ser: 0.95 mg/dL (ref 0.76–1.27)
GFR, EST AFRICAN AMERICAN: 111 mL/min/{1.73_m2} (ref >59)
GFR, EST NON AFRICAN AMERICAN: 96 mL/min/{1.73_m2} (ref >59)
GLOBULIN, TOTAL: 2.6 g/dL (ref 1.5–4.5)
Glucose: 81 mg/dL (ref 65–99)
POTASSIUM: 4.7 mmol/L (ref 3.5–5.2)
SODIUM: 138 mmol/L (ref 134–144)
TOTAL PROTEIN: 7.6 g/dL (ref 6.0–8.5)

## 2017-03-09 LAB — LIPID PANEL
CHOL/HDL RATIO: 3 ratio (ref 0.0–5.0)
Cholesterol, Total: 213 mg/dL — ABNORMAL HIGH (ref 100–199)
HDL: 70 mg/dL (ref >39)
LDL Calculated: 124 mg/dL — ABNORMAL HIGH (ref 0–99)
Triglycerides: 93 mg/dL (ref 0–149)
VLDL Cholesterol Cal: 19 mg/dL (ref 5–40)

## 2017-03-09 LAB — GC/CHLAMYDIA PROBE AMP
CHLAMYDIA, DNA PROBE: NEGATIVE
Neisseria gonorrhoeae by PCR: NEGATIVE

## 2017-03-09 LAB — RPR: RPR: NONREACTIVE

## 2017-03-09 LAB — HEMOGLOBIN A1C
ESTIMATED AVERAGE GLUCOSE: 111 mg/dL
HEMOGLOBIN A1C: 5.5 % (ref 4.8–5.6)

## 2017-03-09 LAB — PSA: Prostate Specific Ag, Serum: 0.2 ng/mL (ref 0.0–4.0)

## 2017-03-09 LAB — TSH: TSH: 1.36 u[IU]/mL (ref 0.450–4.500)

## 2017-03-09 LAB — HEPATITIS B SURFACE ANTIGEN: Hepatitis B Surface Ag: NEGATIVE

## 2017-04-10 ENCOUNTER — Ambulatory Visit: Payer: BLUE CROSS/BLUE SHIELD | Admitting: Family Medicine

## 2018-06-23 ENCOUNTER — Other Ambulatory Visit: Payer: Self-pay

## 2018-06-23 ENCOUNTER — Emergency Department (HOSPITAL_COMMUNITY): Payer: Self-pay

## 2018-06-23 ENCOUNTER — Emergency Department (HOSPITAL_COMMUNITY)
Admission: EM | Admit: 2018-06-23 | Discharge: 2018-06-24 | Disposition: A | Discharge status: Home / Self Care | Payer: Self-pay | Attending: Emergency Medicine | Admitting: Emergency Medicine

## 2018-06-23 ENCOUNTER — Encounter (HOSPITAL_COMMUNITY): Payer: Self-pay | Admitting: Nurse Practitioner

## 2018-06-23 DIAGNOSIS — G510 Bell's palsy: Secondary | ICD-10-CM | POA: Insufficient documentation

## 2018-06-23 DIAGNOSIS — F17210 Nicotine dependence, cigarettes, uncomplicated: Secondary | ICD-10-CM | POA: Insufficient documentation

## 2018-06-23 DIAGNOSIS — R079 Chest pain, unspecified: Secondary | ICD-10-CM | POA: Insufficient documentation

## 2018-06-23 DIAGNOSIS — K769 Liver disease, unspecified: Secondary | ICD-10-CM | POA: Insufficient documentation

## 2018-06-23 LAB — CBC
HCT: 45.1 % (ref 39.0–52.0)
Hemoglobin: 14.3 g/dL (ref 13.0–17.0)
MCH: 27.4 pg (ref 26.0–34.0)
MCHC: 31.7 g/dL (ref 30.0–36.0)
MCV: 86.4 fL (ref 80.0–100.0)
NRBC: 0 % (ref 0.0–0.2)
PLATELETS: 136 10*3/uL — AB (ref 150–400)
RBC: 5.22 MIL/uL (ref 4.22–5.81)
RDW: 14.2 % (ref 11.5–15.5)
WBC: 3.4 10*3/uL — ABNORMAL LOW (ref 4.0–10.5)

## 2018-06-23 LAB — BASIC METABOLIC PANEL
Anion gap: 11 (ref 5–15)
BUN: 12 mg/dL (ref 6–20)
CALCIUM: 8.9 mg/dL (ref 8.9–10.3)
CHLORIDE: 105 mmol/L (ref 98–111)
CO2: 22 mmol/L (ref 22–32)
CREATININE: 1.02 mg/dL (ref 0.61–1.24)
GFR calc non Af Amer: >60 mL/min (ref >60)
Glucose, Bld: 109 mg/dL — ABNORMAL HIGH (ref 70–99)
Potassium: 3.8 mmol/L (ref 3.5–5.1)
Sodium: 138 mmol/L (ref 135–145)

## 2018-06-23 LAB — POCT I-STAT TROPONIN I: Troponin i, poc: 0 ng/mL (ref 0.00–0.08)

## 2018-06-23 LAB — D-DIMER, QUANTITATIVE: D-Dimer, Quant: 1.05 ug/mL-FEU — ABNORMAL HIGH (ref 0.00–0.50)

## 2018-06-23 LAB — I-STAT TROPONIN, ED: Troponin i, poc: 0 ng/mL (ref 0.00–0.08)

## 2018-06-23 MED ORDER — SODIUM CHLORIDE 0.9 % IV BOLUS
500.0000 mL | Freq: Once | INTRAVENOUS | Status: AC
  Administered 2018-06-23: 500 mL via INTRAVENOUS

## 2018-06-23 MED ORDER — IOPAMIDOL (ISOVUE-370) INJECTION 76%
100.0000 mL | Freq: Once | INTRAVENOUS | Status: AC | PRN
  Administered 2018-06-24: 100 mL via INTRAVENOUS

## 2018-06-23 MED ORDER — IOPAMIDOL (ISOVUE-370) INJECTION 76%
INTRAVENOUS | Status: AC
  Filled 2018-06-23: qty 100

## 2018-06-23 MED ORDER — SODIUM CHLORIDE (PF) 0.9 % IJ SOLN
INTRAMUSCULAR | Status: AC
  Filled 2018-06-23: qty 50

## 2018-06-23 NOTE — ED Provider Notes (Signed)
[facility]-EMERGENCY DEPT Provider Note   CSN: [id number] Arrival date & time: 06/23/18  1507     History   Chief Complaint Chief Complaint  Patient presents with  . Chest Pain    HPI Andre Morris is a 47 y.o. male.  HPI   47 year old male with chest pain.  Onset around 1 PM today while sitting in the car.  Describes sharp substernal chest pain.  Slowly subsided over several hours now to the point that essentially resolved.  Denies any other associated symptoms such as dyspnea, nausea, diaphoresis.  He did not notice any appreciable exacerbating relieving factors.  He was in his usual state of health earlier in the day.  No known cardiac disease that he is aware of.  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  Denies any unusual leg pain or swelling.  No prior history of DVT.  He is a smoker.  Past Medical History:  Diagnosis Date  . Bell's palsy 04/2016    Patient Active Problem List   Diagnosis Date Noted  . Routine general medical examination at a health care facility 04/27/2015    No past surgical history on file.      Home Medications    Prior to Admission medications   Medication Sig Start Date End Date Taking? Authorizing Provider  varenicline (CHANTIX STARTING MONTH PAK) 0.5 MG X 11 & 1 MG X 42 tablet Take one 0.5 mg tablet by mouth once daily for 3 days, then increase to one 0.5 mg tablet twice daily for 4 days, then increase to one 1 mg tablet twice daily. 03/08/17   Doristine BosworthStallings, Zoe A, MD    Family History Family History  Problem Relation Age of Onset  . Hypertension Mother   . Lung cancer Father   . Lung cancer Maternal Grandmother   . Heart disease Maternal Grandmother   . Hypertension Maternal Grandmother   . Healthy Maternal Grandfather   . Healthy Paternal Grandmother   . Healthy Paternal Grandfather     Social History Social History   Tobacco Use  . Smoking status: Current Every Day Smoker   Packs/day: 0.50    Years: 20.00    Pack years: 10.00    Types: Cigarettes  . Smokeless tobacco: Never Used  Substance Use Topics  . Alcohol use: Yes    Alcohol/week: 0.0 standard drinks    Comment: Occasionanally   . Drug use: No     Allergies   Patient has no known allergies.   Review of Systems Review of Systems  All systems reviewed and negative, other than as noted in HPI.  Physical Exam Updated Vital Signs BP 120/78 (BP Location: Left Arm)   Pulse 80   Temp 98.1 F (36.7 C) (Oral)   Resp 18   SpO2 99%   Physical Exam Vitals signs and nursing note reviewed.  Constitutional:      General: He is not in acute distress.    Appearance: He is well-developed.  HENT:     Head: Normocephalic and atraumatic.  Eyes:     General:        Right eye: No discharge.        Left eye: No discharge.     Conjunctiva/sclera: Conjunctivae normal.  Neck:     Musculoskeletal: Neck supple.  Cardiovascular:     Rate and Rhythm: Normal rate and regular rhythm.     Heart sounds: Normal heart sounds. No  murmur. No friction rub. No gallop.   Pulmonary:     Effort: Pulmonary effort is normal. No respiratory distress.     Breath sounds: Normal breath sounds.  Abdominal:     General: There is no distension.     Palpations: Abdomen is soft.     Tenderness: There is no abdominal tenderness.  Musculoskeletal:        General: No tenderness.     Comments: Lower extremities symmetric as compared to each other. No calf tenderness. Negative Homan's. No palpable cords.   Skin:    General: Skin is warm and dry.  Neurological:     Mental Status: He is alert.  Psychiatric:        Behavior: Behavior normal.        Thought Content: Thought content normal.      ED Treatments / Results  Labs (all labs ordered are listed, but only abnormal results are displayed) Labs Reviewed  BASIC METABOLIC PANEL - Abnormal; Notable for the following components:      Result Value   Glucose, Bld 109 (*)     All other components within normal limits  CBC - Abnormal; Notable for the following components:   WBC 3.4 (*)    Platelets 136 (*)    All other components within normal limits  D-DIMER, QUANTITATIVE (NOT AT [facility]) - Abnormal; Notable for the following components:   D-Dimer, Quant 1.05 (*)    All other components within normal limits  TROPONIN I  I-STAT TROPONIN, ED  POCT I-STAT TROPONIN I    EKG EKG Interpretation  Date/Time:  Saturday June 23 2018 15:20:27 EST Ventricular Rate:  80 PR Interval:    QRS Duration: 77 QT Interval:  378 QTC Calculation: 436 R Axis:   54 Text Interpretation:  Sinus rhythm Borderline prolonged PR interval Non-specific ST-t changes No old tracing to compare Confirmed by Raeford RazorKohut, Kirra Verga [phone]([id number]) on 06/23/2018 9:25:37 PM   Radiology Dg Chest 2 View  Result Date: 06/23/2018 CLINICAL DATA:  Chest pain. EXAM: CHEST - 2 VIEW COMPARISON:  None. FINDINGS: The heart size and mediastinal contours are within normal limits. Both lungs are clear. No pneumothorax or pleural effusion is noted. The visualized skeletal structures are unremarkable. IMPRESSION: No active cardiopulmonary disease. Electronically Signed   By: Lupita RaiderJames  Green Jr, M.D.   On: 06/23/2018 16:15    Procedures Procedures (including critical care time)  Medications Ordered in ED Medications - No data to display   Initial Impression / Assessment and Plan / ED Course  I have reviewed the triage vital signs and the nursing notes.  Pertinent labs & imaging results that were available during my care of the patient were reviewed by me and considered in my medical decision making (see chart for details).     47 year old male with chest pain.  Atypical for ACS.  He is essentially been ruled out with serial enzymes at this point.  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  It was elevated.  He will obtain CTA.  If this is negative, I feel he is appropriate  for outpatient follow-up.  Final Clinical Impressions(s) / ED Diagnoses   Final diagnoses:  Chest pain, unspecified type    ED Discharge Orders    None       Raeford RazorKohut, Westyn Driggers, MD 06/23/18 2342

## 2018-06-23 NOTE — ED Triage Notes (Signed)
Pt is c/o chest discomfort with associated light headedness, onset about 2 hours ago, denies any cardiopulmonary hx.

## 2018-06-24 NOTE — Discharge Instructions (Addendum)
If you develop severe worsening chest pain, shortness of breath, or any other new/concerning symptoms then return to the ER for evaluation.  On the CT scan there is a spot called a hypodensity on your liver. You need to get a dynamic liver protocol MRI or CT scan with a primary care provider.

## 2018-06-24 NOTE — ED Provider Notes (Signed)
Patient CT scan does not show any acute abnormalities.  He is feeling well and wants to go home.  His troponins are negative x2.  I did discuss the abnormal finding on his liver and the need for follow-up imaging.  He will require a PCP.  He endorses no abdominal pain.  We discussed return precautions and he appears stable for discharge home.  Results for orders placed or performed during the hospital encounter of 06/23/18  Basic metabolic panel  Result Value Ref Range   Sodium 138 135 - 145 mmol/L   Potassium 3.8 3.5 - 5.1 mmol/L   Chloride 105 98 - 111 mmol/L   CO2 22 22 - 32 mmol/L   Glucose, Bld 109 (H) 70 - 99 mg/dL   BUN 12 6 - 20 mg/dL   Creatinine, Ser 4.091.02 0.61 - 1.24 mg/dL   Calcium 8.9 8.9 - 81.110.3 mg/dL   GFR calc non Af Amer >60 >60 mL/min   GFR calc Af Amer >60 >60 mL/min   Anion gap 11 5 - 15  CBC  Result Value Ref Range   WBC 3.4 (L) 4.0 - 10.5 K/uL   RBC 5.22 4.22 - 5.81 MIL/uL   Hemoglobin 14.3 13.0 - 17.0 g/dL   HCT 91.445.1 78.239.0 - 95.652.0 %   MCV 86.4 80.0 - 100.0 fL   MCH 27.4 26.0 - 34.0 pg   MCHC 31.7 30.0 - 36.0 g/dL   RDW 21.314.2 08.611.5 - 57.815.5 %   Platelets 136 (L) 150 - 400 K/uL   nRBC 0.0 0.0 - 0.2 %  D-dimer, quantitative (not at [facility])  Result Value Ref Range   D-Dimer, Quant 1.05 (H) 0.00 - 0.50 ug/mL-FEU  I-stat troponin, ED  Result Value Ref Range   Troponin i, poc 0.00 0.00 - 0.08 ng/mL   Comment 3          POCT i-Stat troponin I  Result Value Ref Range   Troponin i, poc 0.00 0.00 - 0.08 ng/mL   Comment 3           Dg Chest 2 View  Result Date: 06/23/2018 CLINICAL DATA:  Chest pain. EXAM: CHEST - 2 VIEW COMPARISON:  None. FINDINGS: The heart size and mediastinal contours are within normal limits. Both lungs are clear. No pneumothorax or pleural effusion is noted. The visualized skeletal structures are unremarkable. IMPRESSION: No active cardiopulmonary disease. Electronically Signed   By: Lupita RaiderJames  Green Jr, M.D.   On: 06/23/2018 16:15   Ct Angio Chest Pe W  And/or Wo Contrast  Result Date: 06/24/2018 CLINICAL DATA:  Acute onset of dyspnea. Angina. EXAM: CT ANGIOGRAPHY CHEST WITH CONTRAST TECHNIQUE: Multidetector CT imaging of the chest was performed using the standard protocol during bolus administration of intravenous contrast. Multiplanar CT image reconstructions and MIPs were obtained to evaluate the vascular anatomy. CONTRAST:  100mL ISOVUE-370 IOPAMIDOL (ISOVUE-370) INJECTION 76% COMPARISON:  Chest radiograph performed 06/23/2018 FINDINGS: Cardiovascular:  There is no evidence of pulmonary embolus. The heart is normal in size. Mild coronary calcification is seen. The great vessels are grossly unremarkable in appearance. There is slight prominence of the proximal descending thoracic aorta, likely within normal limits. Mediastinum/Nodes: The mediastinum is otherwise unremarkable in appearance. No mediastinal lymphadenopathy is seen. No pericardial effusion is identified. The visualized portions of the thyroid gland are unremarkable. No axillary lymphadenopathy is seen. Lungs/Pleura: The lungs are clear bilaterally. No focal consolidation, pleural effusion or pneumothorax is seen. No masses are identified. Upper Abdomen: A nonspecific 1.8 cm  hypodensity is noted at the left hepatic lobe. Additional smaller hypodensities are seen at the left hepatic lobe. The visualized portions of the spleen are unremarkable. The visualized portions of the pancreas, gallbladder, adrenal glands and kidneys are within normal limits. Musculoskeletal: No acute osseous abnormalities are identified. The visualized musculature is unremarkable in appearance. Review of the MIP images confirms the above findings. IMPRESSION: 1. No evidence of pulmonary embolus. 2. Lungs clear bilaterally. 3. Mild coronary artery calcification seen. 4. Nonspecific 1.8 cm hypodensity at the left hepatic lobe. Additional smaller hypodensities at the left hepatic lobe. Would correlate with LFTs, and consider  dynamic liver protocol MRI or CT for further evaluation on an elective nonemergent basis, when the patient is able to hold his breath for the study. Electronically Signed   By: Roanna RaiderJeffery  Chang M.D.   On: 06/24/2018 00:45      Pricilla LovelessGoldston, Johnavon Mcclafferty, MD 06/24/18 [phone]

## 2019-09-03 ENCOUNTER — Emergency Department (HOSPITAL_COMMUNITY)
Admission: EM | Admit: 2019-09-03 | Discharge: 2019-09-03 | Disposition: A | Discharge status: Home / Self Care | Payer: Self-pay | Attending: Emergency Medicine | Admitting: Emergency Medicine

## 2019-09-03 ENCOUNTER — Encounter (HOSPITAL_COMMUNITY): Payer: Self-pay

## 2019-09-03 ENCOUNTER — Emergency Department (HOSPITAL_COMMUNITY): Payer: Self-pay

## 2019-09-03 ENCOUNTER — Other Ambulatory Visit: Payer: Self-pay

## 2019-09-03 DIAGNOSIS — I714 Abdominal aortic aneurysm, without rupture, unspecified: Secondary | ICD-10-CM

## 2019-09-03 DIAGNOSIS — Z20822 Contact with and (suspected) exposure to covid-19: Secondary | ICD-10-CM | POA: Insufficient documentation

## 2019-09-03 DIAGNOSIS — F17210 Nicotine dependence, cigarettes, uncomplicated: Secondary | ICD-10-CM | POA: Insufficient documentation

## 2019-09-03 LAB — COMPREHENSIVE METABOLIC PANEL
ALT: 12 U/L (ref 0–44)
AST: 15 U/L (ref 15–41)
Albumin: 3.6 g/dL (ref 3.5–5.0)
Alkaline Phosphatase: 66 U/L (ref 38–126)
Anion gap: 8 (ref 5–15)
BUN: 9 mg/dL (ref 6–20)
CO2: 26 mmol/L (ref 22–32)
Calcium: 8.7 mg/dL — ABNORMAL LOW (ref 8.9–10.3)
Chloride: 105 mmol/L (ref 98–111)
Creatinine, Ser: 0.84 mg/dL (ref 0.61–1.24)
GFR calc Af Amer: >60 mL/min (ref >60)
GFR calc non Af Amer: >60 mL/min (ref >60)
Glucose, Bld: 82 mg/dL (ref 70–99)
Potassium: 4.1 mmol/L (ref 3.5–5.1)
Sodium: 139 mmol/L (ref 135–145)
Total Bilirubin: 0.7 mg/dL (ref 0.3–1.2)
Total Protein: 6.3 g/dL — ABNORMAL LOW (ref 6.5–8.1)

## 2019-09-03 LAB — CBC
HCT: 41 % (ref 39.0–52.0)
Hemoglobin: 13 g/dL (ref 13.0–17.0)
MCH: 27.6 pg (ref 26.0–34.0)
MCHC: 31.7 g/dL (ref 30.0–36.0)
MCV: 87 fL (ref 80.0–100.0)
Platelets: 123 10*3/uL — ABNORMAL LOW (ref 150–400)
RBC: 4.71 MIL/uL (ref 4.22–5.81)
RDW: 14.4 % (ref 11.5–15.5)
WBC: 3.2 10*3/uL — ABNORMAL LOW (ref 4.0–10.5)
nRBC: 0 % (ref 0.0–0.2)

## 2019-09-03 LAB — URINALYSIS, ROUTINE W REFLEX MICROSCOPIC
Bilirubin Urine: NEGATIVE
Glucose, UA: NEGATIVE mg/dL
Hgb urine dipstick: NEGATIVE
Ketones, ur: NEGATIVE mg/dL
Leukocytes,Ua: NEGATIVE
Nitrite: NEGATIVE
Protein, ur: NEGATIVE mg/dL
Specific Gravity, Urine: 1.014 (ref 1.005–1.030)
pH: 7 (ref 5.0–8.0)

## 2019-09-03 LAB — LIPASE, BLOOD: Lipase: 22 U/L (ref 11–51)

## 2019-09-03 MED ORDER — IOHEXOL 350 MG/ML SOLN
100.0000 mL | Freq: Once | INTRAVENOUS | Status: AC | PRN
  Administered 2019-09-03: 100 mL via INTRAVENOUS

## 2019-09-03 NOTE — ED Provider Notes (Signed)
[facility] EMERGENCY DEPARTMENT Provider Note   CSN: [id number] Arrival date & time: 09/03/19  1353     History Chief Complaint  Patient presents with  . Abdominal Pain    Andre Morris is a 49 y.o. male.  49 y.o male with a PMH of Bell's palsy presents to the ED sent by UC for pulsatile mass found on physical exam today. 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  He reports on his physical exam he was found to have an 8 cm mass.  He is currently a 1 pack a day smoker, reports he is not had any pains or felt any comfort to his abdomen region. He denies any complaints such as chest pain, shortness of breath, dizzines or abdominal pain.   The history is provided by the patient and medical records.       Past Medical History:  Diagnosis Date  . Bell's palsy 04/2016    Patient Active Problem List   Diagnosis Date Noted  . Routine general medical examination at a health care facility 04/27/2015    History reviewed. No pertinent surgical history.     Family History  Problem Relation Age of Onset  . Hypertension Mother   . Lung cancer Father   . Lung cancer Maternal Grandmother   . Heart disease Maternal Grandmother   . Hypertension Maternal Grandmother   . Healthy Maternal Grandfather   . Healthy Paternal Grandmother   . Healthy Paternal Grandfather     Social History   Tobacco Use  . Smoking status: Current Every Day Smoker    Packs/day: 0.50    Years: 20.00    Pack years: 10.00    Types: Cigarettes  . Smokeless tobacco: Never Used  Substance Use Topics  . Alcohol use: Yes    Alcohol/week: 0.0 standard drinks    Comment: Occasionanally   . Drug use: No    Home Medications Prior to Admission medications   Medication Sig Start Date End Date Taking? Authorizing Provider  varenicline (CHANTIX STARTING MONTH PAK) 0.5 MG X 11 & 1 MG X 42 tablet Take one 0.5 mg tablet by mouth once  daily for 3 days, then increase to one 0.5 mg tablet twice daily for 4 days, then increase to one 1 mg tablet twice daily. Patient not taking: Reported on 06/23/2018 03/08/17   Doristine Bosworth, MD    Allergies    Patient has no known allergies.  Review of Systems   Review of Systems  Constitutional: Negative for fever.  HENT: Negative for sore throat.   Respiratory: Negative for shortness of breath.   Cardiovascular: Negative for chest pain.  Gastrointestinal: Negative for abdominal pain, nausea and vomiting.  Genitourinary: Negative for flank pain.  Musculoskeletal: Negative for back pain.  Skin: Negative for pallor and wound.  Neurological: Negative for light-headedness and headaches.  All other systems reviewed and are negative.   Physical Exam Updated Vital Signs BP (!) 135/107   Pulse 71   Temp 97.8 F (36.6 C) (Oral)   Resp (!) 28   Ht 5\' 11"  (1.803 m)   Wt 83.5 kg   SpO2 99%   BMI 25.66 kg/m   Physical Exam Vitals and nursing note reviewed.  Constitutional:      Appearance: He is well-developed.  HENT:     Head: Normocephalic and atraumatic.  Cardiovascular:     Rate and Rhythm: Normal  rate.  Pulmonary:     Effort: Pulmonary effort is normal.     Breath sounds: No wheezing, rhonchi or rales.  Abdominal:     General: Abdomen is flat.     Palpations: Abdomen is soft. There is mass.     Tenderness: There is no abdominal tenderness.     Hernia: No hernia is present.  Skin:    General: Skin is warm and dry.  Neurological:     Mental Status: He is alert and oriented to person, place, and time.     ED Results / Procedures / Treatments   Labs (all labs ordered are listed, but only abnormal results are displayed) Labs Reviewed  COMPREHENSIVE METABOLIC PANEL - Abnormal; Notable for the following components:      Result Value   Calcium 8.7 (*)    Total Protein 6.3 (*)    All other components within normal limits  CBC - Abnormal; Notable for the following  components:   WBC 3.2 (*)    Platelets 123 (*)    All other components within normal limits  SARS CORONAVIRUS 2 (TAT 6-24 HRS)  LIPASE, BLOOD  URINALYSIS, ROUTINE W REFLEX MICROSCOPIC    EKG None  Radiology CT Angio Chest/Abd/Pel for Dissection W and/or Wo Contrast  Result Date: 09/03/2019 CLINICAL DATA:  Evaluate AAA EXAM: CT ANGIOGRAPHY CHEST, ABDOMEN AND PELVIS TECHNIQUE: Multidetector CT imaging through the chest, abdomen and pelvis was performed using the standard protocol during bolus administration of intravenous contrast. Multiplanar reconstructed images and MIPs were obtained and reviewed to evaluate the vascular anatomy. CONTRAST:  185mL OMNIPAQUE IOHEXOL 350 MG/ML SOLN COMPARISON:  06/24/2018 FINDINGS: CTA CHEST FINDINGS Cardiovascular: Precontrast images show no hyperdense crescent to suggest acute aortic abnormality. Thoracic aorta is well visualize without evidence of aneurysmal dilatation or dissection. The origins of the brachiocephalic vessels are within normal limits. Mild atherosclerotic calcifications are seen. The pulmonary artery shows no large central embolus. No cardiac enlargement is seen. Mild coronary calcifications are noted. Mediastinum/Nodes: Thoracic inlet is within normal limits. No hilar or mediastinal adenopathy is noted. The esophagus as visualized is within normal limits. Lungs/Pleura: Lungs are well aerated bilaterally. No focal infiltrate or sizable effusion is seen. No parenchymal nodules are noted. Musculoskeletal: No chest wall abnormality. No acute or significant osseous findings. Review of the MIP images confirms the above findings. CTA ABDOMEN AND PELVIS FINDINGS VASCULAR Aorta: Abdominal aorta demonstrates dilatation in the infrarenal aorta measuring 6.6 x 6.4 cm in greatest AP and transverse dimensions respectively. Considerable mural thrombus is noted within the aorta. The infrarenal neck measures approximately 3 cm long with a diameter of approximately  2.2 cm. The aneurysmal dilatation extends to the level of the aortic bifurcation with aneurysmal dilatation of the common iliac arteries bilaterally. Celiac: Patent without evidence of aneurysm, dissection, vasculitis or significant stenosis. SMA: Patent without evidence of aneurysm, dissection, vasculitis or significant stenosis. Renals: Single renal arteries are identified bilaterally. IMA: Occluded at its origin due to the mural thrombus within the aneurysm. Iliacs: Dilatation of the common iliac arteries is noted to maximum of 3 cm on the left and 2.4 cm on the right. Only minimal mural thrombus is seen. The external iliac and common femoral arteries are within normal limits. Veins: No specific venous abnormality is noted. Review of the MIP images confirms the above findings. NON-VASCULAR Hepatobiliary: Cysts are noted within the liver stable from the prior exam. The gallbladder is within normal limits. Pancreas: Unremarkable. No pancreatic ductal dilatation or surrounding  inflammatory changes. Spleen: Normal in size without focal abnormality. Adrenals/Urinary Tract: Adrenal glands are within normal limits. Kidneys demonstrate a normal enhancement pattern bilaterally. No calculi are seen. Small cortical cyst is noted on the left measuring 1 cm. The bladder is partially distended. Stomach/Bowel: Appendix is within normal limits. No obstructive or inflammatory changes of the large or small bowel are seen. The stomach is within normal limits. Lymphatic: No significant lymphadenopathy is noted. Reproductive: Prostate is unremarkable. Other: No abdominal wall hernia or abnormality. No abdominopelvic ascites. Musculoskeletal: No acute or significant osseous findings. Review of the MIP images confirms the above findings. IMPRESSION: CTA of the chest: No evidence of aortic aneurysmal dilatation or dissection. No pulmonary artery emboli are seen. No other focal abnormality is noted. CTA of the abdomen and pelvis:  Infrarenal abdominal aorta as described above measuring 6.6 cm in greatest dimension. Vascular surgery consultation recommended due to increased risk of rupture for AAA >5.5 cm. This recommendation follows ACR consensus guidelines: White Paper of the ACR Incidental Findings Committee II on Vascular Findings. J Am Coll Radiol 2013; 10:789-794. 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 Dilatation of the common iliac arteries bilaterally as described. Hepatic and renal cysts. Electronically Signed   By: Alcide Clever M.D.   On: 09/03/2019 17:52    Procedures Procedures (including critical care time)  Medications Ordered in ED Medications  iohexol (OMNIPAQUE) 350 MG/ML injection 100 mL (100 mLs Intravenous Contrast Given 09/03/19 1720)    ED Course  I have reviewed the triage vital signs and the nursing notes.  Pertinent labs & imaging results that were available during my care of the patient were reviewed by me and considered in my medical decision making (see chart for details).  Clinical Course as of Sep 03 2015  Tue Sep 03, 2019  1857 WBC(!): 3.2 [JS]    Clinical Course User Index [JS] Claude Manges, PA-C   MDM Rules/Calculators/A&P   Patient with a past medical history presents to the ED by urgent care after an abnormal physical exam.  Patient reports he was urged to obtain a physical examination for work purposes.  He is currently a 1 pack a day smoker, was found to have an "8 cm pulsatile mass to the abdomen ", sent to the ED for further evaluation rule out AAA.  During my evaluation patient stable, vitals are within normal limits, pressure is slightly soft at 109/74, no tachycardia, no hypoxia.  During my physical evaluation patient's lungs are clear to auscultation, pulses are present and symmetrical his radial, DP along PT.  He denies any pain to his chest, shortness of breath,or cold extremities.  After extensive chart  review imaging from 06/24/2018 showed: slight prominence of the proximal descending thoracic aorta   Labs reviewed by me with mild leukopenia, hemoglobin is stable. CMP without any electrolyte dysfunction, creatine level is normal. LFTs are normal. UA without any signs of infection. Patient remains asymptomatic.    Will obtain CT Chest/abd/pelvis dissection study.   CTA of the chest: No evidence of aortic aneurysmal dilatation or  dissection.    No pulmonary artery emboli are seen.    No other focal abnormality is noted.    CTA of the abdomen and pelvis: Infrarenal abdominal aorta as  described above measuring 6.6 cm in greatest dimension. Vascular  surgery consultation recommended due to increased risk of rupture  for AAA >5.5 cm. This recommendation follows ACR consensus  guidelines:  White Paper of the ACR Incidental Findings Committee II  on Vascular Findings. J Am Coll Radiol 2013; 10:789-794. Aortic  aneurysm NOS (ICD10-I71.9)    Occluded IMA at its origin due to mural thrombus within the  aneurysm.    Dilatation of the common iliac arteries bilaterally as described.    Hepatic and renal cysts.     6:07 PM Patient was updated on results from CT imaging, a vascular consult was placed for further recommendations. Patient vitals remain stable, and he is without symptoms.   6:54 PM Spoke to Dr. Linnell Fulling who will evaluate patient in the ED.   8:16 PM Patient evaluated by Dr. Linnell Fulling who recommended repair via OR on Thursday. Covid 19 testing sent off. Patient is otherwise stable for discharge.    Portions of this note were generated with Scientist, clinical (histocompatibility and immunogenetics). Dictation errors may occur despite best attempts at proofreading.  Final Clinical Impression(s) / ED Diagnoses Final diagnoses:  Abdominal aortic aneurysm (AAA) without rupture [facility])    Rx / DC Orders ED Discharge Orders    None       Claude Manges, Cordelia Poche 09/03/19 2017    Little, Ambrose Finland, MD  09/04/19 1103

## 2019-09-03 NOTE — ED Triage Notes (Signed)
Pt sent by UC to rule out AAA. 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 Pt denies any chest pain or abd pain. Pt a.o, ambulatory.

## 2019-09-03 NOTE — ED Notes (Signed)
Pt transported to CT ?

## 2019-09-03 NOTE — Consult Note (Signed)
Vascular and Vein Specialist of [facility]  Patient name: Andre Morris MRN: [id number] DOB: June 14, 1971 Sex: male   REQUESTING PROVIDER:    ER   REASON FOR CONSULT:    AAA  HISTORY OF PRESENT ILLNESS:   Andre Morris is a 49 y.o. male, who came to urgent care for a physical exam for work and was found to have a pulsatile mass.  He was sent to the ER for further evaluation.  A CTA revealed a 6.6 cm AAA.  He does not have any abdominal pain.  The patient denies any history of stroke or claudication.  He says he does have a family history of aneurysmal disease in his uncle.  There is no significant cardiac history.  The patient does smoke.  He is a Naval architect.  His only other past medical history is Bell's palsy.  PAST MEDICAL HISTORY    Past Medical History:  Diagnosis Date  . Bell's palsy 04/2016     FAMILY HISTORY   Family History  Problem Relation Age of Onset  . Hypertension Mother   . Lung cancer Father   . Lung cancer Maternal Grandmother   . Heart disease Maternal Grandmother   . Hypertension Maternal Grandmother   . Healthy Maternal Grandfather   . Healthy Paternal Grandmother   . Healthy Paternal Grandfather     SOCIAL HISTORY:   Social History   Socioeconomic History  . Marital status: Single    Spouse name: Not on file  . Number of children: 4  . Years of education: 43  . Highest education level: Not on file  Occupational History  . Occupation: OTR Naval architect  Tobacco Use  . Smoking status: Current Every Day Smoker    Packs/day: 0.50    Years: 20.00    Pack years: 10.00    Types: Cigarettes  . Smokeless tobacco: Never Used  Substance and Sexual Activity  . Alcohol use: Yes    Alcohol/week: 0.0 standard drinks    Comment: Occasionanally   . Drug use: No  . Sexual activity: Not on file  Other Topics Concern  . Not on file  Social History Narrative   Fun: Go-carts, anything adventurous.    Denies religious beliefs effecting health care.    Social Determinants of Health   Financial Resource Strain:   . Difficulty of Paying Living Expenses: Not on file  Food Insecurity:   . Worried About Programme researcher, broadcasting/film/video in the Last Year: Not on file  . Ran Out of Food in the Last Year: Not on file  Transportation Needs:   . Lack of Transportation (Medical): Not on file  . Lack of Transportation (Non-Medical): Not on file  Physical Activity:   . Days of Exercise per Week: Not on file  . Minutes of Exercise per Session: Not on file  Stress:   . Feeling of Stress : Not on file  Social Connections:   . Frequency of Communication with Friends and Family: Not on file  . Frequency of Social Gatherings with Friends and Family: Not on file  . Attends Religious Services: Not on file  . Active Member of Clubs or Organizations: Not on file  . Attends Banker Meetings: Not on file  . Marital Status: Not on file  Intimate Partner Violence:   . Fear of Current or Ex-Partner: Not on file  . Emotionally Abused: Not on file  . Physically Abused: Not on file  . Sexually Abused: Not  on file    ALLERGIES:    No Known Allergies  CURRENT MEDICATIONS:    No current facility-administered medications for this encounter.   Current Outpatient Medications  Medication Sig Dispense Refill  . varenicline (CHANTIX STARTING MONTH PAK) 0.5 MG X 11 & 1 MG X 42 tablet Take one 0.5 mg tablet by mouth once daily for 3 days, then increase to one 0.5 mg tablet twice daily for 4 days, then increase to one 1 mg tablet twice daily. (Patient not taking: Reported on 06/23/2018) 53 tablet 0    REVIEW OF SYSTEMS:   [X]  denotes positive finding, [ ]  denotes negative finding Cardiac  Comments:  Chest pain or chest pressure:    Shortness of breath upon exertion:    Short of breath when lying flat:    Irregular heart rhythm:        Vascular    Pain in calf, thigh, or hip brought on by ambulation:      Pain in feet at night that wakes you up from your sleep:     Blood clot in your veins:    Leg swelling:         Pulmonary    Oxygen at home:    Productive cough:     Wheezing:         Neurologic    Sudden weakness in arms or legs:     Sudden numbness in arms or legs:     Sudden onset of difficulty speaking or slurred speech:    Temporary loss of vision in one eye:     Problems with dizziness:         Gastrointestinal    Blood in stool:      Vomited blood:         Genitourinary    Burning when urinating:     Blood in urine:        Psychiatric    Major depression:         Hematologic    Bleeding problems:    Problems with blood clotting too easily:        Skin    Rashes or ulcers:        Constitutional    Fever or chills:     PHYSICAL EXAM:   Vitals:   09/03/19 1830 09/03/19 1845 09/03/19 1903 09/03/19 2036  BP:   (!) 135/107 (!) 134/91  Pulse: 81 67 71 80  Resp: 20 15 (!) 28 14  Temp:    98.2 F (36.8 C)  TempSrc:    Oral  SpO2: 100% 100% 99% 100%  Weight:      Height:        GENERAL: The patient is a well-nourished male, in no acute distress. The vital signs are documented above. CARDIAC: There is a regular rate and rhythm.  VASCULAR: Palpable femoral popliteal and pedal pulses bilaterally. PULMONARY: Nonlabored respirations ABDOMEN: Soft and non-tender.  He does have a pulsatile mass which is not tender to deep palpation.  MUSCULOSKELETAL: There are no major deformities or cyanosis. NEUROLOGIC: No focal weakness or paresthesias are detected. SKIN: There are no ulcers or rashes noted. PSYCHIATRIC: The patient has a normal affect.  STUDIES:   I have reviewed his CTA with the following findings:  CTA of the chest: No evidence of aortic aneurysmal dilatation or dissection.  No pulmonary artery emboli are seen.  No other focal abnormality is noted.  CTA of the abdomen and pelvis: Infrarenal abdominal aorta as described  above measuring 6.6 cm  in greatest dimension. Vascular surgery consultation recommended due to increased risk of rupture for AAA >5.5 cm. This recommendation follows ACR consensus guidelines: White Paper of the ACR Incidental Findings Committee II on Vascular Findings. J Am Coll Radiol 2013; 10:789-794. 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  Dilatation of the common iliac arteries bilaterally as described.  Hepatic and renal cysts. ASSESSMENT and PLAN   AAA: I discussed with the patient that he has a very large infrarenal abdominal aortic aneurysm.  I have recommended repair to prevent rupture.  I believe he is an endovascular candidate.  I discussed the details of the procedure including the risks and benefits which include but are not limited to stroke, intestinal ischemia, renal insufficiency, access complications and distal embolization, as well as cardiopulmonary complications and bleeding.  I would like to get this done as soon as possible.  I placed him on the schedule for Thursday.  He will get Covid testing prior to leaving the emergency department.  While he is in the hospital, he will need formal lower extremity duplex to rule out popliteal aneurysm as well as carotid artery duplex.  I will also contact Dr. Meda Coffee for cardiac screening.  My office will contact him tomorrow for further details regarding surgery.   Leia Alf, MD, FACS Vascular and Vein Specialists of [facility] [phone] Pager [phone]

## 2019-09-03 NOTE — Discharge Instructions (Signed)
You have been scheduled for surgical repair in 2 days with our vascular surgery colleagues.  Please monitor your condition carefully, and do not hesitate to return here if you develop new, or concerning changes in the interim.  Otherwise, please follow-up with our surgery colleagues in 2 days.

## 2019-09-03 NOTE — H&P (View-Only) (Signed)
 Vascular and Vein Specialist of [place]  Patient name: Andre Morris MRN: [id number] DOB: 04/05/1971 Sex: male   REQUESTING PROVIDER:    ER   REASON FOR CONSULT:    AAA  HISTORY OF PRESENT ILLNESS:   Andre Morris is a 49 y.o. male, who came to urgent care for a physical exam for work and was found to have a pulsatile mass.  He was sent to the ER for further evaluation.  A CTA revealed a 6.6 cm AAA.  He does not have any abdominal pain.  The patient denies any history of stroke or claudication.  He says he does have a family history of aneurysmal disease in his uncle.  There is no significant cardiac history.  The patient does smoke.  He is a truck driver.  His only other past medical history is Bell's palsy.  PAST MEDICAL HISTORY    Past Medical History:  Diagnosis Date  . Bell's palsy 04/2016     FAMILY HISTORY   Family History  Problem Relation Age of Onset  . Hypertension Mother   . Lung cancer Father   . Lung cancer Maternal Grandmother   . Heart disease Maternal Grandmother   . Hypertension Maternal Grandmother   . Healthy Maternal Grandfather   . Healthy Paternal Grandmother   . Healthy Paternal Grandfather     SOCIAL HISTORY:   Social History   Socioeconomic History  . Marital status: Single    Spouse name: Not on file  . Number of children: 4  . Years of education: 12  . Highest education level: Not on file  Occupational History  . Occupation: OTR Truck Driver  Tobacco Use  . Smoking status: Current Every Day Smoker    Packs/day: 0.50    Years: 20.00    Pack years: 10.00    Types: Cigarettes  . Smokeless tobacco: Never Used  Substance and Sexual Activity  . Alcohol use: Yes    Alcohol/week: 0.0 standard drinks    Comment: Occasionanally   . Drug use: No  . Sexual activity: Not on file  Other Topics Concern  . Not on file  Social History Narrative   Fun: Go-carts, anything adventurous.    Denies religious beliefs effecting health care.    Social Determinants of Health   Financial Resource Strain:   . Difficulty of Paying Living Expenses: Not on file  Food Insecurity:   . Worried About Running Out of Food in the Last Year: Not on file  . Ran Out of Food in the Last Year: Not on file  Transportation Needs:   . Lack of Transportation (Medical): Not on file  . Lack of Transportation (Non-Medical): Not on file  Physical Activity:   . Days of Exercise per Week: Not on file  . Minutes of Exercise per Session: Not on file  Stress:   . Feeling of Stress : Not on file  Social Connections:   . Frequency of Communication with Friends and Family: Not on file  . Frequency of Social Gatherings with Friends and Family: Not on file  . Attends Religious Services: Not on file  . Active Member of Clubs or Organizations: Not on file  . Attends Club or Organization Meetings: Not on file  . Marital Status: Not on file  Intimate Partner Violence:   . Fear of Current or Ex-Partner: Not on file  . Emotionally Abused: Not on file  . Physically Abused: Not on file  . Sexually Abused: Not   on file    ALLERGIES:    No Known Allergies  CURRENT MEDICATIONS:    No current facility-administered medications for this encounter.   Current Outpatient Medications  Medication Sig Dispense Refill  . varenicline (CHANTIX STARTING MONTH PAK) 0.5 MG X 11 & 1 MG X 42 tablet Take one 0.5 mg tablet by mouth once daily for 3 days, then increase to one 0.5 mg tablet twice daily for 4 days, then increase to one 1 mg tablet twice daily. (Patient not taking: Reported on 06/23/2018) 53 tablet 0    REVIEW OF SYSTEMS:   [X]  denotes positive finding, [ ]  denotes negative finding Cardiac  Comments:  Chest pain or chest pressure:    Shortness of breath upon exertion:    Short of breath when lying flat:    Irregular heart rhythm:        Vascular    Pain in calf, thigh, or hip brought on by ambulation:      Pain in feet at night that wakes you up from your sleep:     Blood clot in your veins:    Leg swelling:         Pulmonary    Oxygen at home:    Productive cough:     Wheezing:         Neurologic    Sudden weakness in arms or legs:     Sudden numbness in arms or legs:     Sudden onset of difficulty speaking or slurred speech:    Temporary loss of vision in one eye:     Problems with dizziness:         Gastrointestinal    Blood in stool:      Vomited blood:         Genitourinary    Burning when urinating:     Blood in urine:        Psychiatric    Major depression:         Hematologic    Bleeding problems:    Problems with blood clotting too easily:        Skin    Rashes or ulcers:        Constitutional    Fever or chills:     PHYSICAL EXAM:   Vitals:   09/03/19 1830 09/03/19 1845 09/03/19 1903 09/03/19 2036  BP:   (!) 135/107 (!) 134/91  Pulse: 81 67 71 80  Resp: 20 15 (!) 28 14  Temp:    98.2 F (36.8 C)  TempSrc:    Oral  SpO2: 100% 100% 99% 100%  Weight:      Height:        GENERAL: The patient is a well-nourished male, in no acute distress. The vital signs are documented above. CARDIAC: There is a regular rate and rhythm.  VASCULAR: Palpable femoral popliteal and pedal pulses bilaterally. PULMONARY: Nonlabored respirations ABDOMEN: Soft and non-tender.  He does have a pulsatile mass which is not tender to deep palpation.  MUSCULOSKELETAL: There are no major deformities or cyanosis. NEUROLOGIC: No focal weakness or paresthesias are detected. SKIN: There are no ulcers or rashes noted. PSYCHIATRIC: The patient has a normal affect.  STUDIES:   I have reviewed his CTA with the following findings:  CTA of the chest: No evidence of aortic aneurysmal dilatation or dissection.  No pulmonary artery emboli are seen.  No other focal abnormality is noted.  CTA of the abdomen and pelvis: Infrarenal abdominal aorta as described  above measuring 6.6 cm  in greatest dimension. Vascular surgery consultation recommended due to increased risk of rupture for AAA >5.5 cm. This recommendation follows ACR consensus guidelines: White Paper of the ACR Incidental Findings Committee II on Vascular Findings. J Am Coll Radiol 2013; 10:789-794. 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  Dilatation of the common iliac arteries bilaterally as described.  Hepatic and renal cysts. ASSESSMENT and PLAN   AAA: I discussed with the patient that he has a very large infrarenal abdominal aortic aneurysm.  I have recommended repair to prevent rupture.  I believe he is an endovascular candidate.  I discussed the details of the procedure including the risks and benefits which include but are not limited to stroke, intestinal ischemia, renal insufficiency, access complications and distal embolization, as well as cardiopulmonary complications and bleeding.  I would like to get this done as soon as possible.  I placed him on the schedule for Thursday.  He will get Covid testing prior to leaving the emergency department.  While he is in the hospital, he will need formal lower extremity duplex to rule out popliteal aneurysm as well as carotid artery duplex.  I will also contact Dr. Meda Coffee for cardiac screening.  My office will contact him tomorrow for further details regarding surgery.   Leia Alf, MD, FACS Vascular and Vein Specialists of [facility] [phone] Pager [phone]

## 2019-09-04 ENCOUNTER — Other Ambulatory Visit: Payer: Self-pay | Admitting: Emergency Medicine

## 2019-09-04 ENCOUNTER — Telehealth (HOSPITAL_COMMUNITY): Payer: Self-pay | Admitting: Emergency Medicine

## 2019-09-04 ENCOUNTER — Other Ambulatory Visit (HOSPITAL_COMMUNITY): Payer: Self-pay | Admitting: Emergency Medicine

## 2019-09-04 ENCOUNTER — Other Ambulatory Visit (HOSPITAL_COMMUNITY): Payer: Self-pay | Admitting: Surgery

## 2019-09-04 ENCOUNTER — Other Ambulatory Visit: Payer: Self-pay

## 2019-09-04 ENCOUNTER — Encounter (HOSPITAL_COMMUNITY): Payer: Self-pay

## 2019-09-04 DIAGNOSIS — R0789 Other chest pain: Secondary | ICD-10-CM

## 2019-09-04 DIAGNOSIS — R0602 Shortness of breath: Secondary | ICD-10-CM

## 2019-09-04 LAB — SARS CORONAVIRUS 2 (TAT 6-24 HRS): SARS Coronavirus 2: NEGATIVE

## 2019-09-04 NOTE — Telephone Encounter (Signed)
Reaching out to patient to offer assistance regarding upcoming cardiac imaging study; pt verbalizes understanding of appt date/time, parking situation and where to check in, pre-test NPO status and medications ordered, and verified current allergies; name and call back number provided for further questions should they arise Pieper Kasik RN Navigator Cardiac Imaging [facility] [phone] office [phone] cell 

## 2019-09-06 ENCOUNTER — Ambulatory Visit (HOSPITAL_COMMUNITY): Payer: Self-pay

## 2019-09-06 ENCOUNTER — Other Ambulatory Visit: Payer: Self-pay

## 2019-09-06 NOTE — Progress Notes (Signed)
[facility] [address] [place] [address] Phone: [phone] Fax: [phone]      Your procedure is scheduled on Thursday, March 18th.  Report to [facility] Main Entrance "A" at 5:30 A.M., and check in at the Admitting office.  Call this number if you have problems the morning of surgery:  [phone]  Call [phone] if you have any questions prior to your surgery date Monday-Friday 8am-4pm    Remember:  Do not eat or drink after midnight the night before your surgery     Take these medicines the morning of surgery with A SIP OF WATER   NONE  7 days prior to surgery STOP taking any Aspirin (unless otherwise instructed by your surgeon), Aleve, Naproxen, Ibuprofen, Motrin, Advil, Goody's, BC's, all herbal medications, fish oil, and all vitamins.    The Morning of Surgery  Do not wear jewelry.  Do not wear lotions, powders, colognes, or deodorant  Men may shave face and neck.  Do not bring valuables to the hospital.  [facility] is not responsible for any belongings or valuables.  If you are a smoker, DO NOT Smoke 24 hours prior to surgery  If you wear a CPAP at night please bring your mask the morning of surgery   Remember that you must have someone to transport you home after your surgery, and remain with you for 24 hours if you are discharged the same day.   Please bring cases for contacts, glasses, hearing aids, dentures or bridgework because it cannot be worn into surgery.    Leave your suitcase in the car.  After surgery it may be brought to your room.  For patients admitted to the hospital, discharge time will be determined by your treatment team.  Patients discharged the day of surgery will not be allowed to drive home.    Special instructions:   [facility]- Preparing For Surgery  Before surgery, you can play an important role. Because skin is not  sterile, your skin needs to be as free of germs as possible. You can reduce the number of germs on your skin by washing with CHG (chlorahexidine gluconate) Soap before surgery.  CHG is an antiseptic cleaner which kills germs and bonds with the skin to continue killing germs even after washing.    Oral Hygiene is also important to reduce your risk of infection.  Remember - BRUSH YOUR TEETH THE MORNING OF SURGERY WITH YOUR REGULAR TOOTHPASTE  Please do not use if you have an allergy to CHG or antibacterial soaps. If your skin becomes reddened/irritated stop using the CHG.  Do not shave (including legs and underarms) for at least 48 hours prior to first CHG shower. It is OK to shave your face.  Please follow these instructions carefully.   1. Shower the NIGHT BEFORE SURGERY and the MORNING OF SURGERY with CHG Soap.   2. If you chose to wash your hair, wash your hair first as usual with your normal shampoo.  3. After you shampoo, rinse your hair and body thoroughly to remove the shampoo.  4. Use CHG as you would any other liquid soap. You can apply CHG directly to the skin and wash gently with a scrungie or a clean washcloth.   5. Apply the CHG Soap to your body ONLY FROM THE NECK DOWN.  Do not use on open wounds or open sores. Avoid  contact with your eyes, ears, mouth and genitals (private parts). Wash Face and genitals (private parts)  with your normal soap.   6. Wash thoroughly, paying special attention to the area where your surgery will be performed.  7. Thoroughly rinse your body with warm water from the neck down.  8. DO NOT shower/wash with your normal soap after using and rinsing off the CHG Soap.  9. Pat yourself dry with a CLEAN TOWEL.  10. Wear CLEAN PAJAMAS to bed the night before surgery, wear comfortable clothes the morning of surgery  11. Place CLEAN SHEETS on your bed the night of your first shower and DO NOT SLEEP WITH PETS.    Day of Surgery:  Please shower the  morning of surgery with the CHG soap Do not apply any deodorants/lotions. Please wear clean clothes to the hospital/surgery center.   Remember to brush your teeth WITH YOUR REGULAR TOOTHPASTE.   Please read over the following fact sheets that you were given.

## 2019-09-09 ENCOUNTER — Encounter (HOSPITAL_COMMUNITY)
Admission: RE | Admit: 2019-09-09 | Discharge: 2019-09-09 | Disposition: A | Discharge status: Home / Self Care | Payer: Self-pay | Source: Ambulatory Visit | Attending: Surgery | Admitting: Surgery

## 2019-09-09 ENCOUNTER — Other Ambulatory Visit: Payer: Self-pay

## 2019-09-09 ENCOUNTER — Encounter (HOSPITAL_COMMUNITY): Payer: Self-pay

## 2019-09-09 DIAGNOSIS — Z01812 Encounter for preprocedural laboratory examination: Secondary | ICD-10-CM | POA: Insufficient documentation

## 2019-09-09 HISTORY — DX: Gastro-esophageal reflux disease without esophagitis: K21.9

## 2019-09-09 LAB — PROTIME-INR
INR: 1.1 (ref 0.8–1.2)
Prothrombin Time: 13.7 seconds (ref 11.4–15.2)

## 2019-09-09 LAB — TYPE AND SCREEN
ABO/RH(D): O POS
Antibody Screen: NEGATIVE

## 2019-09-09 LAB — ABO/RH: ABO/RH(D): O POS

## 2019-09-09 LAB — SURGICAL PCR SCREEN
MRSA, PCR: NEGATIVE
Staphylococcus aureus: NEGATIVE

## 2019-09-09 LAB — APTT: aPTT: 34 seconds (ref 24–36)

## 2019-09-09 NOTE — Progress Notes (Signed)
PCP - denies Cardiologist - denies   Chest x-ray - n/a EKG - 09-04-19  COVID TEST- Wednesday   Anesthesia review: yes, EKG  Patient denies shortness of breath, fever, cough and chest pain at PAT appointment   All instructions explained to the patient, with a verbal understanding of the material. Patient agrees to go over the instructions while at home for a better understanding. Patient also instructed to self quarantine after being tested for COVID-19. The opportunity to ask questions was provided.

## 2019-09-11 ENCOUNTER — Other Ambulatory Visit (HOSPITAL_COMMUNITY)
Admission: RE | Admit: 2019-09-11 | Discharge: 2019-09-11 | Disposition: A | Discharge status: Home / Self Care | Payer: HRSA Program | Source: Ambulatory Visit | Attending: Surgery | Admitting: Surgery

## 2019-09-11 DIAGNOSIS — Z01812 Encounter for preprocedural laboratory examination: Secondary | ICD-10-CM | POA: Insufficient documentation

## 2019-09-11 DIAGNOSIS — Z20822 Contact with and (suspected) exposure to covid-19: Secondary | ICD-10-CM | POA: Insufficient documentation

## 2019-09-11 LAB — SARS CORONAVIRUS 2 (TAT 6-24 HRS): SARS Coronavirus 2: NEGATIVE

## 2019-09-12 ENCOUNTER — Encounter (HOSPITAL_COMMUNITY): Payer: Self-pay | Admitting: Surgery

## 2019-09-12 ENCOUNTER — Inpatient Hospital Stay (HOSPITAL_COMMUNITY): Payer: Self-pay | Admitting: Certified Registered Nurse Anesthetist

## 2019-09-12 ENCOUNTER — Inpatient Hospital Stay (HOSPITAL_COMMUNITY)
Admission: RE | Admit: 2019-09-12 | Discharge: 2019-09-13 | DRG: 269 | Disposition: A | Discharge status: Home Health | Payer: Self-pay | Attending: Surgery | Admitting: Surgery

## 2019-09-12 ENCOUNTER — Other Ambulatory Visit: Payer: Self-pay

## 2019-09-12 ENCOUNTER — Inpatient Hospital Stay (HOSPITAL_COMMUNITY): Payer: Self-pay

## 2019-09-12 ENCOUNTER — Inpatient Hospital Stay (HOSPITAL_COMMUNITY): Payer: Self-pay | Admitting: Physician Assistant

## 2019-09-12 ENCOUNTER — Encounter (HOSPITAL_COMMUNITY)
Admission: RE | Disposition: A | Discharge status: Home Health | Payer: Self-pay | Source: Home / Self Care | Attending: Surgery

## 2019-09-12 DIAGNOSIS — I714 Abdominal aortic aneurysm, without rupture, unspecified: Secondary | ICD-10-CM | POA: Diagnosis present

## 2019-09-12 DIAGNOSIS — Z20822 Contact with and (suspected) exposure to covid-19: Secondary | ICD-10-CM | POA: Diagnosis present

## 2019-09-12 DIAGNOSIS — Z801 Family history of malignant neoplasm of trachea, bronchus and lung: Secondary | ICD-10-CM

## 2019-09-12 DIAGNOSIS — G510 Bell's palsy: Secondary | ICD-10-CM | POA: Diagnosis present

## 2019-09-12 DIAGNOSIS — Z8249 Family history of ischemic heart disease and other diseases of the circulatory system: Secondary | ICD-10-CM

## 2019-09-12 DIAGNOSIS — K219 Gastro-esophageal reflux disease without esophagitis: Secondary | ICD-10-CM | POA: Diagnosis present

## 2019-09-12 DIAGNOSIS — I739 Peripheral vascular disease, unspecified: Secondary | ICD-10-CM | POA: Diagnosis present

## 2019-09-12 DIAGNOSIS — F17210 Nicotine dependence, cigarettes, uncomplicated: Secondary | ICD-10-CM | POA: Diagnosis present

## 2019-09-12 DIAGNOSIS — Z9889 Other specified postprocedural states: Secondary | ICD-10-CM

## 2019-09-12 HISTORY — PX: EMBOLIZATION: SHX5507

## 2019-09-12 HISTORY — PX: ABDOMINAL AORTIC ENDOVASCULAR STENT GRAFT: SHX5707

## 2019-09-12 HISTORY — PX: ULTRASOUND GUIDANCE FOR VASCULAR ACCESS: SHX6516

## 2019-09-12 LAB — BASIC METABOLIC PANEL
Anion gap: 9 (ref 5–15)
BUN: 7 mg/dL (ref 6–20)
CO2: 22 mmol/L (ref 22–32)
Calcium: 8.6 mg/dL — ABNORMAL LOW (ref 8.9–10.3)
Chloride: 106 mmol/L (ref 98–111)
Creatinine, Ser: 0.86 mg/dL (ref 0.61–1.24)
GFR calc Af Amer: >60 mL/min (ref >60)
GFR calc non Af Amer: >60 mL/min (ref >60)
Glucose, Bld: 127 mg/dL — ABNORMAL HIGH (ref 70–99)
Potassium: 4.1 mmol/L (ref 3.5–5.1)
Sodium: 137 mmol/L (ref 135–145)

## 2019-09-12 LAB — POCT I-STAT 7, (LYTES, BLD GAS, ICA,H+H)
Bicarbonate: 26.7 mmol/L (ref 20.0–28.0)
Calcium, Ion: 1.24 mmol/L (ref 1.15–1.40)
HCT: 39 % (ref 39.0–52.0)
Hemoglobin: 13.3 g/dL (ref 13.0–17.0)
O2 Saturation: 100 %
Patient temperature: 35.4
Potassium: 4.4 mmol/L (ref 3.5–5.1)
Sodium: 138 mmol/L (ref 135–145)
TCO2: 28 mmol/L (ref 22–32)
pCO2 arterial: 46.9 mmHg (ref 32.0–48.0)
pH, Arterial: 7.355 (ref 7.350–7.450)
pO2, Arterial: 195 mmHg — ABNORMAL HIGH (ref 83.0–108.0)

## 2019-09-12 LAB — PROTIME-INR
INR: 1.2 (ref 0.8–1.2)
Prothrombin Time: 15.2 seconds (ref 11.4–15.2)

## 2019-09-12 LAB — CBC
HCT: 40.7 % (ref 39.0–52.0)
Hemoglobin: 13.1 g/dL (ref 13.0–17.0)
MCH: 27.5 pg (ref 26.0–34.0)
MCHC: 32.2 g/dL (ref 30.0–36.0)
MCV: 85.5 fL (ref 80.0–100.0)
Platelets: 147 10*3/uL — ABNORMAL LOW (ref 150–400)
RBC: 4.76 MIL/uL (ref 4.22–5.81)
RDW: 14.4 % (ref 11.5–15.5)
WBC: 5.7 10*3/uL (ref 4.0–10.5)
nRBC: 0 % (ref 0.0–0.2)

## 2019-09-12 LAB — POCT ACTIVATED CLOTTING TIME
Activated Clotting Time: 235 seconds
Activated Clotting Time: 252 seconds
Activated Clotting Time: 208 seconds

## 2019-09-12 LAB — MAGNESIUM: Magnesium: 2 mg/dL (ref 1.7–2.4)

## 2019-09-12 LAB — APTT: aPTT: 36 seconds (ref 24–36)

## 2019-09-12 SURGERY — INSERTION, ENDOVASCULAR STENT GRAFT, AORTA, ABDOMINAL
Anesthesia: General

## 2019-09-12 MED ORDER — IODIXANOL 320 MG/ML IV SOLN
INTRAVENOUS | Status: DC | PRN
  Administered 2019-09-12: 1 mL
  Administered 2019-09-12: 150 mL

## 2019-09-12 MED ORDER — FENTANYL CITRATE (PF) 100 MCG/2ML IJ SOLN: 25.0000 ug | INTRAMUSCULAR | Status: DC | PRN

## 2019-09-12 MED ORDER — CHLORHEXIDINE GLUCONATE CLOTH 2 % EX PADS: 6.0000 | MEDICATED_PAD | Freq: Once | CUTANEOUS | Status: DC

## 2019-09-12 MED ORDER — MIDAZOLAM HCL 2 MG/2ML IJ SOLN
INTRAMUSCULAR | Status: AC
  Filled 2019-09-12: qty 2

## 2019-09-12 MED ORDER — DEXAMETHASONE SODIUM PHOSPHATE 10 MG/ML IJ SOLN
INTRAMUSCULAR | Status: AC
  Filled 2019-09-12: qty 1

## 2019-09-12 MED ORDER — GUAIFENESIN-DM 100-10 MG/5ML PO SYRP: 15.0000 mL | ORAL_SOLUTION | ORAL | Status: DC | PRN

## 2019-09-12 MED ORDER — ACETAMINOPHEN 325 MG RE SUPP: 325.0000 mg | RECTAL | Status: DC | PRN

## 2019-09-12 MED ORDER — SODIUM CHLORIDE 0.9 % IV SOLN: INTRAVENOUS | Status: DC

## 2019-09-12 MED ORDER — PROPOFOL 10 MG/ML IV BOLUS
INTRAVENOUS | Status: DC | PRN
  Administered 2019-09-12: 20 mg via INTRAVENOUS
  Administered 2019-09-12: 140 mg via INTRAVENOUS
  Administered 2019-09-12: 40 mg via INTRAVENOUS

## 2019-09-12 MED ORDER — LIDOCAINE 2% (20 MG/ML) 5 ML SYRINGE
INTRAMUSCULAR | Status: AC
  Filled 2019-09-12: qty 5

## 2019-09-12 MED ORDER — MAGNESIUM SULFATE 2 GM/50ML IV SOLN: 2.0000 g | Freq: Every day | INTRAVENOUS | Status: DC | PRN

## 2019-09-12 MED ORDER — ROCURONIUM BROMIDE 10 MG/ML (PF) SYRINGE
PREFILLED_SYRINGE | INTRAVENOUS | Status: DC | PRN
  Administered 2019-09-12: 100 mg via INTRAVENOUS
  Administered 2019-09-12: 20 mg via INTRAVENOUS
  Administered 2019-09-12: 10 mg via INTRAVENOUS

## 2019-09-12 MED ORDER — ALBUMIN HUMAN 5 % IV SOLN: INTRAVENOUS | Status: DC | PRN

## 2019-09-12 MED ORDER — HEPARIN SODIUM (PORCINE) 1000 UNIT/ML IJ SOLN
INTRAMUSCULAR | Status: DC | PRN
  Administered 2019-09-12: 8000 [IU] via INTRAVENOUS
  Administered 2019-09-12: 1000 [IU] via INTRAVENOUS

## 2019-09-12 MED ORDER — LACTATED RINGERS IV SOLN: INTRAVENOUS | Status: DC | PRN

## 2019-09-12 MED ORDER — POTASSIUM CHLORIDE CRYS ER 20 MEQ PO TBCR: 20.0000 meq | EXTENDED_RELEASE_TABLET | Freq: Every day | ORAL | Status: DC | PRN

## 2019-09-12 MED ORDER — OXYCODONE HCL 5 MG PO TABS: 5.0000 mg | ORAL_TABLET | Freq: Once | ORAL | Status: DC | PRN

## 2019-09-12 MED ORDER — SENNOSIDES-DOCUSATE SODIUM 8.6-50 MG PO TABS: 1.0000 | ORAL_TABLET | Freq: Every evening | ORAL | Status: DC | PRN

## 2019-09-12 MED ORDER — SODIUM CHLORIDE 0.9 % IV SOLN
INTRAVENOUS | Status: DC | PRN
  Administered 2019-09-12 (×2): 500 mL

## 2019-09-12 MED ORDER — LIDOCAINE 2% (20 MG/ML) 5 ML SYRINGE
INTRAMUSCULAR | Status: DC | PRN
  Administered 2019-09-12: 60 mg via INTRAVENOUS

## 2019-09-12 MED ORDER — CEFAZOLIN SODIUM-DEXTROSE 2-4 GM/100ML-% IV SOLN
2.0000 g | INTRAVENOUS | Status: AC
  Administered 2019-09-12: 2 g via INTRAVENOUS
  Filled 2019-09-12: qty 100

## 2019-09-12 MED ORDER — ONDANSETRON HCL 4 MG/2ML IJ SOLN
INTRAMUSCULAR | Status: DC | PRN
  Administered 2019-09-12: 4 mg via INTRAVENOUS

## 2019-09-12 MED ORDER — ACETAMINOPHEN 160 MG/5ML PO SOLN: 1000.0000 mg | Freq: Once | ORAL | Status: DC | PRN

## 2019-09-12 MED ORDER — ACETAMINOPHEN 325 MG PO TABS: 325.0000 mg | ORAL_TABLET | ORAL | Status: DC | PRN

## 2019-09-12 MED ORDER — FENTANYL CITRATE (PF) 250 MCG/5ML IJ SOLN
INTRAMUSCULAR | Status: DC | PRN
  Administered 2019-09-12: 100 ug via INTRAVENOUS
  Administered 2019-09-12 (×3): 50 ug via INTRAVENOUS

## 2019-09-12 MED ORDER — MIDAZOLAM HCL 5 MG/5ML IJ SOLN
INTRAMUSCULAR | Status: DC | PRN
  Administered 2019-09-12: 2 mg via INTRAVENOUS

## 2019-09-12 MED ORDER — HYDRALAZINE HCL 20 MG/ML IJ SOLN: 5.0000 mg | INTRAMUSCULAR | Status: DC | PRN

## 2019-09-12 MED ORDER — SUGAMMADEX SODIUM 200 MG/2ML IV SOLN
INTRAVENOUS | Status: DC | PRN
  Administered 2019-09-12: 50 mg via INTRAVENOUS
  Administered 2019-09-12: 150 mg via INTRAVENOUS

## 2019-09-12 MED ORDER — ALUM & MAG HYDROXIDE-SIMETH 200-200-20 MG/5ML PO SUSP: 15.0000 mL | ORAL | Status: DC | PRN

## 2019-09-12 MED ORDER — CEFAZOLIN SODIUM-DEXTROSE 2-4 GM/100ML-% IV SOLN
2.0000 g | Freq: Three times a day (TID) | INTRAVENOUS | Status: AC
  Administered 2019-09-12 (×2): 2 g via INTRAVENOUS
  Filled 2019-09-12 (×2): qty 100

## 2019-09-12 MED ORDER — ONDANSETRON HCL 4 MG/2ML IJ SOLN
4.0000 mg | Freq: Four times a day (QID) | INTRAMUSCULAR | Status: DC | PRN
  Administered 2019-09-12: 4 mg via INTRAVENOUS
  Filled 2019-09-12: qty 2

## 2019-09-12 MED ORDER — ONDANSETRON HCL 4 MG/2ML IJ SOLN
INTRAMUSCULAR | Status: AC
  Filled 2019-09-12: qty 2

## 2019-09-12 MED ORDER — ACETAMINOPHEN 500 MG PO TABS: 1000.0000 mg | ORAL_TABLET | Freq: Once | ORAL | Status: DC | PRN

## 2019-09-12 MED ORDER — ROCURONIUM BROMIDE 10 MG/ML (PF) SYRINGE
PREFILLED_SYRINGE | INTRAVENOUS | Status: AC
  Filled 2019-09-12: qty 10

## 2019-09-12 MED ORDER — SODIUM CHLORIDE 0.9 % IV SOLN: 500.0000 mL | Freq: Once | INTRAVENOUS | Status: DC | PRN

## 2019-09-12 MED ORDER — ASPIRIN EC 81 MG PO TBEC
81.0000 mg | DELAYED_RELEASE_TABLET | Freq: Every day | ORAL | Status: DC
  Administered 2019-09-13: 81 mg via ORAL
  Filled 2019-09-12: qty 1

## 2019-09-12 MED ORDER — OXYCODONE HCL 5 MG/5ML PO SOLN: 5.0000 mg | Freq: Once | ORAL | Status: DC | PRN

## 2019-09-12 MED ORDER — ROSUVASTATIN CALCIUM 5 MG PO TABS
10.0000 mg | ORAL_TABLET | Freq: Every day | ORAL | Status: DC
  Administered 2019-09-12: 10 mg via ORAL
  Filled 2019-09-12: qty 2

## 2019-09-12 MED ORDER — OXYCODONE HCL 5 MG PO TABS
5.0000 mg | ORAL_TABLET | ORAL | Status: DC | PRN
  Administered 2019-09-12: 10 mg via ORAL
  Filled 2019-09-12: qty 2

## 2019-09-12 MED ORDER — DOCUSATE SODIUM 100 MG PO CAPS
100.0000 mg | ORAL_CAPSULE | Freq: Every day | ORAL | Status: DC
  Administered 2019-09-13: 100 mg via ORAL
  Filled 2019-09-12: qty 1

## 2019-09-12 MED ORDER — METOPROLOL TARTRATE 5 MG/5ML IV SOLN: 2.0000 mg | INTRAVENOUS | Status: DC | PRN

## 2019-09-12 MED ORDER — BISACODYL 5 MG PO TBEC: 5.0000 mg | DELAYED_RELEASE_TABLET | Freq: Every day | ORAL | Status: DC | PRN

## 2019-09-12 MED ORDER — ACETAMINOPHEN 10 MG/ML IV SOLN: 1000.0000 mg | Freq: Once | INTRAVENOUS | Status: DC | PRN

## 2019-09-12 MED ORDER — LABETALOL HCL 5 MG/ML IV SOLN: 10.0000 mg | INTRAVENOUS | Status: DC | PRN

## 2019-09-12 MED ORDER — FENTANYL CITRATE (PF) 250 MCG/5ML IJ SOLN
INTRAMUSCULAR | Status: AC
  Filled 2019-09-12: qty 5

## 2019-09-12 MED ORDER — PROPOFOL 10 MG/ML IV BOLUS
INTRAVENOUS | Status: AC
  Filled 2019-09-12: qty 20

## 2019-09-12 MED ORDER — HEPARIN SODIUM (PORCINE) 5000 UNIT/ML IJ SOLN
5000.0000 [IU] | Freq: Three times a day (TID) | INTRAMUSCULAR | Status: DC
  Administered 2019-09-13 (×2): 5000 [IU] via SUBCUTANEOUS
  Filled 2019-09-12 (×2): qty 1

## 2019-09-12 MED ORDER — HYDROMORPHONE HCL 1 MG/ML IJ SOLN: 0.5000 mg | INTRAMUSCULAR | Status: DC | PRN

## 2019-09-12 MED ORDER — SODIUM CHLORIDE 0.9 % IV SOLN
INTRAVENOUS | Status: AC
  Filled 2019-09-12: qty 1.2

## 2019-09-12 MED ORDER — PHENOL 1.4 % MT LIQD: 1.0000 | OROMUCOSAL | Status: DC | PRN

## 2019-09-12 MED ORDER — ESMOLOL HCL 100 MG/10ML IV SOLN
INTRAVENOUS | Status: DC | PRN
  Administered 2019-09-12 (×2): 20 mg via INTRAVENOUS

## 2019-09-12 MED ORDER — PANTOPRAZOLE SODIUM 40 MG PO TBEC
40.0000 mg | DELAYED_RELEASE_TABLET | Freq: Every day | ORAL | Status: DC
  Administered 2019-09-13: 40 mg via ORAL
  Filled 2019-09-12: qty 1

## 2019-09-12 MED ORDER — 0.9 % SODIUM CHLORIDE (POUR BTL) OPTIME
TOPICAL | Status: DC | PRN
  Administered 2019-09-12: 1000 mL

## 2019-09-12 MED ORDER — PROTAMINE SULFATE 10 MG/ML IV SOLN
INTRAVENOUS | Status: DC | PRN
  Administered 2019-09-12: 50 mg via INTRAVENOUS

## 2019-09-12 MED ORDER — DEXAMETHASONE SODIUM PHOSPHATE 10 MG/ML IJ SOLN
INTRAMUSCULAR | Status: DC | PRN
  Administered 2019-09-12: 10 mg via INTRAVENOUS

## 2019-09-12 SURGICAL SUPPLY — 85 items
ADH SKN CLS APL DERMABOND .7 (GAUZE/BANDAGES/DRESSINGS) ×6
BLADE CLIPPER SURG (BLADE) ×3 IMPLANT
CANISTER SUCT 3000ML PPV (MISCELLANEOUS) ×5 IMPLANT
CATH ANGIO 5F BER2 100CM (CATHETERS) ×2 IMPLANT
CATH BEACON 5 .035 40 KMP TP (CATHETERS) IMPLANT
CATH BEACON 5 .038 40 KMP TP (CATHETERS)
CATH BEACON 5.038 65CM KMP-01 (CATHETERS) ×5 IMPLANT
CATH OMNI FLUSH .035X70CM (CATHETERS) ×5 IMPLANT
CATH QUICKCROSS SUPP .035X90CM (MICROCATHETER) ×2 IMPLANT
COIL IDC 2D .035 12MMX40CM (Embolic) ×2 IMPLANT
COIL IDC 2D .035 18MMX40CM (Embolic) ×4 IMPLANT
COIL IDC 360 .035 15MMX25CM (Embolic) ×2 IMPLANT
COIL IDC 360 .035 15MMX40CM (Embolic) ×2 IMPLANT
COVER WAND RF STERILE (DRAPES) ×3 IMPLANT
DERMABOND ADVANCED (GAUZE/BANDAGES/DRESSINGS) ×4
DERMABOND ADVANCED .7 DNX12 (GAUZE/BANDAGES/DRESSINGS) ×3 IMPLANT
DEVICE CLOSURE PERCLS PRGLD 6F (VASCULAR PRODUCTS) IMPLANT
DEVICE ENSNARE  12MMX20MM (VASCULAR PRODUCTS) ×5
DEVICE ENSNARE 12MMX20MM (VASCULAR PRODUCTS) IMPLANT
DEVICE TORQUE H2O (MISCELLANEOUS) ×2 IMPLANT
DRAPE ZERO GRAVITY STERILE (DRAPES) ×5 IMPLANT
DRSG TEGADERM 2-3/8X2-3/4 SM (GAUZE/BANDAGES/DRESSINGS) ×10 IMPLANT
DRYSEAL FLEXSHEATH 12FR 33CM (SHEATH) ×2
DRYSEAL FLEXSHEATH 12FR 45CM (SHEATH) ×2
DRYSEAL FLEXSHEATH 16FR 33CM (SHEATH) ×2
DRYSEAL FLEXSHEATH 18FR 33CM (SHEATH) ×2
ELECT CAUTERY BLADE 6.4 (BLADE) ×5 IMPLANT
ELECT REM PT RETURN 9FT ADLT (ELECTROSURGICAL) ×10
ELECTRODE REM PT RTRN 9FT ADLT (ELECTROSURGICAL) ×6 IMPLANT
ENDOPRO ILIAC 23X14X10 FA (Endovascular Graft) ×5 IMPLANT
ENDOPROSTHESIS ILI 23X14X10 FA (Endovascular Graft) IMPLANT
EXCLUDER TNK 28X14.5MMX12CM (Endovascular Graft) IMPLANT
EXCLUDER TRUNK 28X14.5MMX12CM (Endovascular Graft) ×5 IMPLANT
GAUZE SPONGE 2X2 8PLY STRL LF (GAUZE/BANDAGES/DRESSINGS) ×6 IMPLANT
GLOVE BIOGEL PI IND STRL 7.5 (GLOVE) ×3 IMPLANT
GLOVE BIOGEL PI INDICATOR 7.5 (GLOVE) ×2
GLOVE SURG SS PI 7.5 STRL IVOR (GLOVE) ×5 IMPLANT
GOWN STRL REUS W/ TWL LRG LVL3 (GOWN DISPOSABLE) ×6 IMPLANT
GOWN STRL REUS W/ TWL XL LVL3 (GOWN DISPOSABLE) ×6 IMPLANT
GOWN STRL REUS W/TWL LRG LVL3 (GOWN DISPOSABLE) ×10
GOWN STRL REUS W/TWL XL LVL3 (GOWN DISPOSABLE) ×10
GRAFT BALLN CATH 65CM (STENTS) ×3 IMPLANT
GUIDEWIRE ANGLED .035X150CM (WIRE) IMPLANT
GUIDEWIRE ANGLED .035X260CM (WIRE) ×2 IMPLANT
KIT BASIN OR (CUSTOM PROCEDURE TRAY) ×5 IMPLANT
KIT ENCORE 26 ADVANTAGE (KITS) ×2 IMPLANT
KIT TURNOVER KIT B (KITS) ×5 IMPLANT
LEG CONTRALATERAL 16X12X10 (Vascular Products) IMPLANT
LEG CONTRALATERAL 16X12X12 (Vascular Products) ×2 IMPLANT
LEG CONTRALATERAL 16X12X14 (Vascular Products) ×5 IMPLANT
LEG CONTRALATERAL 27X12 (Vascular Products) ×2 IMPLANT
LEG CONTRALATERAL 27X14 (Vascular Products) ×2 IMPLANT
NS IRRIG 1000ML POUR BTL (IV SOLUTION) ×5 IMPLANT
PACK ENDOVASCULAR (PACKS) ×5 IMPLANT
PAD ARMBOARD 7.5X6 YLW CONV (MISCELLANEOUS) ×10 IMPLANT
PENCIL BUTTON HOLSTER BLD 10FT (ELECTRODE) ×5 IMPLANT
PERCLOSE PROGLIDE 6F (VASCULAR PRODUCTS) ×20
SET MICROPUNCTURE 5F STIFF (MISCELLANEOUS) ×5 IMPLANT
SHEATH BRITE TIP 8FR 23CM (SHEATH) ×5 IMPLANT
SHEATH DRYSEAL FLEX 12FR 33CM (SHEATH) IMPLANT
SHEATH DRYSEAL FLEX 12FR 45CM (SHEATH) IMPLANT
SHEATH DRYSEAL FLEX 16FR 33CM (SHEATH) IMPLANT
SHEATH DRYSEAL FLEX 18FR 33CM (SHEATH) IMPLANT
SHEATH GUIDING 8.5FR 77X17 (SHEATH) ×2 IMPLANT
SHEATH PINNACLE 8F 10CM (SHEATH) ×5 IMPLANT
SHEATH PINNACLE ST 7F 45CM (SHEATH) ×2 IMPLANT
SHIELD RADPAD (MISCELLANEOUS) ×4 IMPLANT
SPONGE GAUZE 2X2 STER 10/PKG (GAUZE/BANDAGES/DRESSINGS)
STENT GRAFT BALLN CATH 65CM (STENTS) ×5
STENT GRAFT CONTRALAT 16X12X10 (Vascular Products) IMPLANT
STENT GRAFT CONTRALAT 16X12X14 (Vascular Products) IMPLANT
STOPCOCK MORSE 400PSI 3WAY (MISCELLANEOUS) ×5 IMPLANT
SUT PROLENE 5 0 C 1 24 (SUTURE) IMPLANT
SUT VIC AB 2-0 CT1 27 (SUTURE)
SUT VIC AB 2-0 CT1 TAPERPNT 27 (SUTURE) IMPLANT
SUT VIC AB 3-0 SH 27 (SUTURE)
SUT VIC AB 3-0 SH 27X BRD (SUTURE) IMPLANT
SUT VICRYL 4-0 PS2 18IN ABS (SUTURE) IMPLANT
SYR 20ML LL LF (SYRINGE) ×5 IMPLANT
TOWEL GREEN STERILE (TOWEL DISPOSABLE) ×5 IMPLANT
TRAY FOLEY MTR SLVR 16FR STAT (SET/KITS/TRAYS/PACK) ×5 IMPLANT
TUBING HIGH PRESSURE 120CM (CONNECTOR) ×5 IMPLANT
WIRE AMPLATZ SS-J .035X180CM (WIRE) ×10 IMPLANT
WIRE BENTSON .035X145CM (WIRE) ×10 IMPLANT
WIRE ROSEN-J .035X260CM (WIRE) ×2 IMPLANT

## 2019-09-12 NOTE — Anesthesia Procedure Notes (Signed)
Arterial Line Insertion Start/End3/18/2021 7:10 AM Performed by: Laruth Bouchard., CRNA, CRNA  Patient location: Pre-op. Preanesthetic checklist: patient identified, IV checked, site marked, risks and benefits discussed, surgical consent, monitors and equipment checked, pre-op evaluation, timeout performed and anesthesia consent Left, radial was placed Catheter size: 20 G Hand hygiene performed  and maximum sterile barriers used   Attempts: 1 Procedure performed without using ultrasound guided technique. Following insertion, dressing applied and Biopatch. Post procedure assessment: normal  Patient tolerated the procedure well with no immediate complications. Additional procedure comments: Placed by [place] Ingram, SRNA.

## 2019-09-12 NOTE — Anesthesia Preprocedure Evaluation (Signed)
Anesthesia Evaluation  Patient identified by MRN, date of birth, ID band Patient awake    Reviewed: Allergy & Precautions, NPO status , Patient's Chart, lab work & pertinent test results  History of Anesthesia Complications Negative for: history of anesthetic complications  Airway Mallampati: II  TM Distance: >3 FB Neck ROM: Full    Dental  (+) Dental Advisory Given, Teeth Intact   Pulmonary neg shortness of breath, neg COPD, neg recent URI, Current Smoker and Patient abstained from smoking.,    breath sounds clear to auscultation       Cardiovascular + Peripheral Vascular Disease   Rhythm:Regular   6.6 cm AAA   Neuro/Psych negative neurological ROS  negative psych ROS   GI/Hepatic Neg liver ROS, GERD  Controlled,  Endo/Other  negative endocrine ROS  Renal/GU negative Renal ROS     Musculoskeletal negative musculoskeletal ROS (+)   Abdominal   Peds  Hematology plt 123   Anesthesia Other Findings   Reproductive/Obstetrics                             Anesthesia Physical Anesthesia Plan  ASA: II  Anesthesia Plan: General   Post-op Pain Management:    Induction: Intravenous  PONV Risk Score and Plan: 1 and Ondansetron and Dexamethasone  Airway Management Planned: Oral ETT  Additional Equipment: Arterial line  Intra-op Plan:   Post-operative Plan: Extubation in OR  Informed Consent: I have reviewed the patients History and Physical, chart, labs and discussed the procedure including the risks, benefits and alternatives for the proposed anesthesia with the patient or authorized representative who has indicated his/her understanding and acceptance.     Dental advisory given  Plan Discussed with: CRNA  Anesthesia Plan Comments:         Anesthesia Quick Evaluation

## 2019-09-12 NOTE — Progress Notes (Addendum)
  Progress Note    09/12/2019 3:06 PM Day of Surgery  Subjective:  Some soreness of his back. Also complaining about foley catheter otherwise no complaints  Vitals:   09/12/19 1300 09/12/19 1400  BP: 127/87 122/89  Pulse: 62 67  Resp: 13 17  Temp:    SpO2: 99% 99%   Physical Exam: General: well appearing, well nourished, not in any discomfort Lungs: non labored Extremities:  2+ bilateral femoral pulses, 2+ distal lower extremity pulses bilaterally. Right groin access site clean, dry, intact with Dermabond. No hematoma Abdomen:  Soft, non tender, non distended Neurologic: Alert and oriented  CBC    Component Value Date/Time   WBC 5.7 09/12/2019 1110   RBC 4.76 09/12/2019 1110   HGB 13.1 09/12/2019 1110   HCT 40.7 09/12/2019 1110   PLT 147 (L) 09/12/2019 1110   MCV 85.5 09/12/2019 1110   MCH 27.5 09/12/2019 1110   MCHC 32.2 09/12/2019 1110   RDW 14.4 09/12/2019 1110    BMET    Component Value Date/Time   NA 137 09/12/2019 1110   NA 138 03/08/2017 1236   K 4.1 09/12/2019 1110   CL 106 09/12/2019 1110   CO2 22 09/12/2019 1110   GLUCOSE 127 (H) 09/12/2019 1110   BUN 7 09/12/2019 1110   BUN 13 03/08/2017 1236   CREATININE 0.86 09/12/2019 1110   CALCIUM 8.6 (L) 09/12/2019 1110   GFRNONAA >60 09/12/2019 1110   GFRAA >60 09/12/2019 1110    INR    Component Value Date/Time   INR 1.2 09/12/2019 1110     Intake/Output Summary (Last 24 hours) at 09/12/2019 1506 Last data filed at 09/12/2019 1105 Gross per 24 hour  Intake 1750 ml  Output 1850 ml  Net -100 ml     Assessment/Plan:  49 y.o. male is s/p Abdominal Aortic Endovascular Stent Graft  Day of Surgery. Patient is doing well. Pain well controlled. Femoral access site without Hematoma. D/C foley. Increase mobilization starting today. Advance diet as tolerated. Arterial duplex of lower extremities to eval for possible popliteal aneurysms and also carotid duplex ordered  DVT Prophylaxis: SCDs  Graceann Congress, PA-C [facility] [phone] 09/12/2019 3:06 PM   I agree with the above.  Depending on his renal function, we will also obtain a coronary CTA, per Dr. Emogene Morgan

## 2019-09-12 NOTE — Op Note (Signed)
Patient name: Andre Morris MRN: [id number] DOB: 1971-02-02 Sex: male  09/12/2019 Pre-operative Diagnosis: AAA Post-operative diagnosis:  Same Surgeon:  Durene Cal Assistants: Clinton Gallant Procedure:   #1: Endovascular repair of abdominal aortic aneurysm   #2: Bilateral ultrasound-guided common femoral artery percutaneous access   #3: Selective access into the left and right internal iliac artery with pelvic angiography    #4: Coil embolization of the left internal iliac artery   #5: Abdominal aortogram Anesthesia: General Blood Loss: 300 cc Specimens: None  Findings: Complete exclusion.  Successful embolization of left hypogastric artery.  The original plan was for a iliac branch device on the right however upon additional imaging, a proximal branch was visualized in the right internal iliac artery which was prohibitive for iliac branch device.  Therefore I elected to place a bell bottom device landing in the distal right common iliac artery.  A total of 151 cc of contrast was utilized.  Devices used: Main body was primary right Gore 28 x 14 x 12.  Ipsilateral right extensions were a Gore 27 x 14 and 27 x 12.  Ipsilateral limbs were Gore 12 x 14 and 12 x 12.  Coil embolization was performed using interlock coils: (18 x 40 x 2, 15 x 40, 15 x 25, 12 x 40)  Indications: This is a 49 year old gentleman who was found to have an incidental 6.6 cm infrarenal abdominal aortic aneurysm on a work physical exam.  He comes in today for endovascular repair.  Procedure:  The patient was identified in the holding area and taken to [facility] OR ROOM 16  The patient was then placed supine on the table. general anesthesia was administered.  The patient was prepped and draped in the usual sterile fashion.  A time out was called and antibiotics were administered.  Ultrasound was used to evaluate bilateral common femoral arteries which are widely patent with mild calcification.  A #11 blade was used to make a  skin nick bilaterally.  Bilateral common femoral arteries were then cannulated under ultrasound guidance with a micropuncture needle.  018 wires were advanced without resistance followed by placement of a micropuncture sheath.  An 035 wire was inserted.  An 8 [place] dilator was used to dilate the subcutaneous tract and Pro-glide devices were deployed at the 11:00 and 1 o'clock position for preclosure.  Over a Bentson wire, a 8 French sheath was placed in the left groin and a 8 x 45 sheath was advanced into the right groin.  The patient was then fully heparinized.  I then used a Omni Flush catheter, Bentson wire and Glidewire to attempt to cross the aortic bifurcation from the right.  Because of the sharp angulation, I could not get enough purchase to advance a 8 French sheath over the bifurcation.  I then removed the 8 French sheath from the left groin and then placed a 7.5 [place] Oscore sheath into the left groin.  It was deflected and used to cannulate the right hypogastric artery.  I then inserted a Berenstein 2 catheter over a Bentson wire into the hypogastric artery and performed pelvic angiography which detailed the left hypogastric artery anatomy.  Coil embolization was then performed beginning at the primary branch point.  Interlocked coils were placed.  I first placed two 18 x 40 coils followed by 15 x 40, 15 x 25, and 12 x 40.  Repeat angiography confirmed successful coil embolization of the left hypogastric artery.  Next a Amplatz superstiff wire  was advanced into the aorta from the left side the Oscore sheath was removed and a 12 [place] long Gore dry seal sheath was inserted up to the aortic bifurcation.  On the right side, a 16 French dry seal was inserted over a Amplatz superstiff wire.  A snare was then inserted into the aorta from the left side and used to snare a Glidewire advanced through the 6 dry seal sheath from the right.  The Glidewire was then brought out through the 12 [place] sheath on the  left.  I then inserted a iliac branch device over both wires (Amplatz and Glidewire) from the right side.  Additional imaging was then performed.  This was to evaluate the anatomy of the right iliac system.  A proximal branch was noted which was not visualized on CT scan which was concerning for iliac branch placement.  I then selected the right hypogastric artery for better imaging.  I felt that this branch was too proximal to place a iliac branch device because the branch would be occluded and I did not want to do that for risk of pelvic ischemia given embolization of the left hypogastric artery.  Therefore I elected to abort the iliac branch plan.  At this point I set up for traditional endovascular repair.  A 18 French sheath was inserted up the right side after removing the 16 [place] sheath.  The long 12 French sheath was exchanged out for a shorter sheath on the left.  The main body device was prepared on the back table.  This was a Gore 28 x 14 x 12 device.  It was advanced up to the level of L2.  A Omni Flush catheter was then advanced up the left side and positioned at the level of L1.  An abdominal aortogram was performed locating the renal arteries.  The main body device was then deployed landing at the level of the renal arteries, down to the contralateral gate.  An additional aortogram was performed confirming successful location of the main body.  Next, the contralateral gate was cannulated with a Berenstein 2 catheter and a Bentson wire.  The Berenstein catheter was removed and a Omni Flush catheter was then inserted, and able to be freely rotated within the main body, confirming successful cannulation of the gate.  A Amplatz superstiff wire was then inserted.  The image detector was then rotated to the right anterior oblique position and a retrograde injection was performed through the sheath in the left groin locating the left hypogastric artery origin.  I then deployed a 12 x 14 iliac limb  followed by a 12 x 12 device landing into the mid to distal left external iliac artery.  The image detector was then rotated to a left anterior oblique position.  The remaining portion of the ipsilateral limb was deployed and the delivery system was removed.  A retrograde injection was performed through the sheath locating the right hypogastric artery.  A 27 x 14 bell bottom device was then deployed.  Upon deployment it migrated proximally landing about 1.5 cm above the left hypogastric artery.  I did not feel this was enough coverage and so a second 27 x 12 valvotomy device was then deployed which landed at the level of the right hypogastric artery.  Next, aMOB balloon was used to mold the proximal and distal attachment sites as well as device overlap.  Completion arteriogram was then performed which showed successful exclusion of the aneurysm with continued patency of bilateral  renal arteries as well as the right hypogastric artery.  There was no evidence of a type I or III endoleak.  I was satisfied with these results.  Bentson wires were then inserted.  The sheaths were then removed and the arteriotomy sites were closed by securing the previously deployed Pro-glide devices.  50 mg of protamine was then administered.  The patient tolerated the procedure well.  Cautery was used in the subcutaneous tissue on each cannulation site followed by Dermabond.  The patient had brisk pedal Doppler signals.  He was then successfully extubated taken recovery in stable condition.  Of note, the patient did lose approximately 300 cc of blood through the flush port of the 18 French sheath on the right side.  He remained hemodynamically stable throughout.  He did not receive any blood.   Disposition: To PACU stable.   Theotis Burrow, M.D., [facility] [place] Office: [phone] Pager:  [phone]

## 2019-09-12 NOTE — Anesthesia Procedure Notes (Signed)
Procedure Name: Intubation Date/Time: 09/12/2019 7:44 AM Performed by: Laruth Bouchard., CRNA Pre-anesthesia Checklist: Patient identified, Emergency Drugs available, Suction available, Patient being monitored and Timeout performed Patient Re-evaluated:Patient Re-evaluated prior to induction Oxygen Delivery Method: Circle system utilized Preoxygenation: Pre-oxygenation with 100% oxygen Induction Type: IV induction Ventilation: Mask ventilation without difficulty Laryngoscope Size: Miller and 3 Grade View: Grade I Tube type: Oral Tube size: 7.5 mm Number of attempts: 1 Airway Equipment and Method: Stylet Placement Confirmation: ETT inserted through vocal cords under direct vision,  positive ETCO2 and breath sounds checked- equal and bilateral Secured at: 22 cm Tube secured with: Tape Dental Injury: Teeth and Oropharynx as per pre-operative assessment

## 2019-09-12 NOTE — Transfer of Care (Signed)
Immediate Anesthesia Transfer of Care Note  Patient: Andre Morris  Procedure(s) Performed: ABDOMINAL AORTIC ENDOVASCULAR STENT GRAFT (N/A ) Coil embolization of left internal iliac artery (Left ) Ultrasound Guidance For Vascular Access  Patient Location: PACU  Anesthesia Type:General  Level of Consciousness: awake, alert  and oriented  Airway & Oxygen Therapy: Patient Spontanous Breathing and Patient connected to face mask oxygen  Post-op Assessment: Report given to RN and Post -op Vital signs reviewed and stable  Post vital signs: Reviewed and stable  Last Vitals:  Vitals Value Taken Time  BP 125/86 09/12/19 1106  Temp    Pulse 60 09/12/19 1112  Resp 17 09/12/19 1112  SpO2 100 % 09/12/19 1112  Vitals shown include unvalidated device data.  Last Pain:  Vitals:   09/12/19 1105  TempSrc:   PainSc: (P) 0-No pain         Complications: No apparent anesthesia complications

## 2019-09-12 NOTE — Progress Notes (Signed)
Pt arrived to rm 4E 22 from PACU with foley cath. VSS, tele initiated. Assessment performed. Call bell within reach. Pt A&Ox4.  Lawson Radar, RN

## 2019-09-12 NOTE — Discharge Instructions (Signed)
   [facility] [place]   Discharge Instructions  Endovascular Aortic Aneurysm Repair  Please refer to the following instructions for your post-procedure care. Your surgeon or Physician Assistant will discuss any changes with you.  Activity  You are encouraged to walk as much as you can. You can slowly return to normal activities but must avoid strenuous activity and heavy lifting until your doctor tells you it's OK. Avoid activities such as vacuuming or swinging a gold club. It is normal to feel tired for several weeks after your surgery. Do not drive until your doctor gives the OK and you are no longer taking prescription pain medications. It is also normal to have difficulty with sleep habits, eating, and bowel movements after surgery. These will go away with time.  Bathing/Showering  You may shower after you go home. If you have an incision, do not soak in a bathtub, hot tub, or swim until the incision heals completely.  Incision Care  Shower every day. Clean your incision with mild soap and water. Pat the area dry with a clean towel. You do not need a bandage unless otherwise instructed. Do not apply any ointments or creams to your incision. If you clothing is irritating, you may cover your incision with a dry gauze pad.  Diet  Resume your normal diet. There are no special food restrictions following this procedure. A low fat/low cholesterol diet is recommended for all patients with vascular disease. In order to heal from your surgery, it is CRITICAL to get adequate nutrition. Your body requires vitamins, minerals, and protein. Vegetables are the best source of vitamins and minerals. Vegetables also provide the perfect balance of protein. Processed food has little nutritional value, so try to avoid this.  Medications  Resume taking all of your medications unless your doctor or nurse practitioner tells you not to. If your incision is causing pain, you may take  over-the-counter pain relievers such as acetaminophen (Tylenol). If you were prescribed a stronger pain medication, please be aware these medications can cause nausea and constipation. Prevent nausea by taking the medication with a snack or meal. Avoid constipation by drinking plenty of fluids and eating foods with a high amount of fiber, such as fruits, vegetables, and grains. Do not take Tylenol if you are taking prescription pain medications.   Follow up  Our office will schedule a follow-up appointment with a C.T. scan 3-4 weeks after your surgery.  Please call us immediately for any of the following conditions  Severe or worsening pain in your legs or feet or in your abdomen back or chest. Increased pain, redness, drainage (pus) from your incision sit. Increased abdominal pain, bloating, nausea, vomiting or persistent diarrhea. Fever of 101 degrees or higher. Swelling in your leg (s),  Reduce your risk of vascular disease  Stop smoking. If you would like help call [facility] at 1-800-QUIT-NOW (1-800-784-8669) or [place] at 336-586-4000. Manage your cholesterol Maintain a desired weight Control your diabetes Keep your blood pressure down  If you have questions, please call the office at [phone].   

## 2019-09-12 NOTE — Interval H&P Note (Signed)
History and Physical Interval Note:  09/12/2019 7:26 AM  Andre Morris  has presented today for surgery, with the diagnosis of ABDOMINAL AORTIC ANEURYSM.  The various methods of treatment have been discussed with the patient and family. After consideration of risks, benefits and other options for treatment, the patient has consented to  Procedure(s): ABDOMINAL AORTIC ENDOVASCULAR STENT GRAFT (N/A) as a surgical intervention.  The patient's history has been reviewed, patient examined, no change in status, stable for surgery.  I have reviewed the patient's chart and labs.  Questions were answered to the patient's satisfaction.     Durene Cal

## 2019-09-13 ENCOUNTER — Inpatient Hospital Stay (HOSPITAL_COMMUNITY): Payer: Self-pay

## 2019-09-13 DIAGNOSIS — I714 Abdominal aortic aneurysm, without rupture: Secondary | ICD-10-CM

## 2019-09-13 DIAGNOSIS — Z9889 Other specified postprocedural states: Secondary | ICD-10-CM

## 2019-09-13 LAB — CBC
HCT: 37.4 % — ABNORMAL LOW (ref 39.0–52.0)
Hemoglobin: 12.3 g/dL — ABNORMAL LOW (ref 13.0–17.0)
MCH: 27.8 pg (ref 26.0–34.0)
MCHC: 32.9 g/dL (ref 30.0–36.0)
MCV: 84.4 fL (ref 80.0–100.0)
Platelets: 126 10*3/uL — ABNORMAL LOW (ref 150–400)
RBC: 4.43 MIL/uL (ref 4.22–5.81)
RDW: 14.5 % (ref 11.5–15.5)
WBC: 6.2 10*3/uL (ref 4.0–10.5)
nRBC: 0 % (ref 0.0–0.2)

## 2019-09-13 LAB — BASIC METABOLIC PANEL
Anion gap: 11 (ref 5–15)
BUN: 8 mg/dL (ref 6–20)
CO2: 24 mmol/L (ref 22–32)
Calcium: 9 mg/dL (ref 8.9–10.3)
Chloride: 103 mmol/L (ref 98–111)
Creatinine, Ser: 0.93 mg/dL (ref 0.61–1.24)
GFR calc Af Amer: >60 mL/min (ref >60)
GFR calc non Af Amer: >60 mL/min (ref >60)
Glucose, Bld: 101 mg/dL — ABNORMAL HIGH (ref 70–99)
Potassium: 3.9 mmol/L (ref 3.5–5.1)
Sodium: 138 mmol/L (ref 135–145)

## 2019-09-13 MED ORDER — ROSUVASTATIN CALCIUM 10 MG PO TABS: 10.0000 mg | ORAL_TABLET | Freq: Every day | ORAL | 3 refills | Status: DC

## 2019-09-13 MED ORDER — ASPIRIN 81 MG PO TBEC: 81.0000 mg | DELAYED_RELEASE_TABLET | Freq: Every day | ORAL | Status: DC

## 2019-09-13 MED ORDER — METOPROLOL TARTRATE 5 MG/5ML IV SOLN
INTRAVENOUS | Status: AC
  Administered 2019-09-13: 5 mg via INTRAVENOUS
  Filled 2019-09-13: qty 5

## 2019-09-13 MED ORDER — IOHEXOL 350 MG/ML SOLN
80.0000 mL | Freq: Once | INTRAVENOUS | Status: AC | PRN
  Administered 2019-09-13: 80 mL via INTRAVENOUS

## 2019-09-13 MED ORDER — NITROGLYCERIN 0.4 MG SL SUBL
SUBLINGUAL_TABLET | SUBLINGUAL | Status: AC
  Administered 2019-09-13: 0.8 mg
  Filled 2019-09-13: qty 2

## 2019-09-13 MED ORDER — OXYCODONE HCL 5 MG PO TABS: 5.0000 mg | ORAL_TABLET | ORAL | 0 refills | Status: DC | PRN

## 2019-09-13 MED FILL — ROSUVASTATIN CALCIUM 10 MG: 10 | 30 days supply | Qty: 30 | Fill #0

## 2019-09-13 MED FILL — OXYCODONE HCL 5 MG TABS: 5 | 2 days supply | Qty: 8 | Fill #0

## 2019-09-13 NOTE — Progress Notes (Addendum)
MOBILITY TEAM - Progress Note   09/13/19 1600  Mobility  Activity Ambulated in hall  Level of Assistance Independent  Assistive Device None  Mobility performed by  (self)   Patient ambulating hallway independently; encouraged continued mobility.  Ina Homes, PT, DPT Mobility Team Pager [phone]

## 2019-09-13 NOTE — Progress Notes (Signed)
Bilateral carotid artery duplex and bilateral lower extremity arterial duplex exams completed.  Preliminary results can be found under CV proc under chart review.  09/13/2019 2:05 PM  Clevester Helzer, K., RDMS, RVT

## 2019-09-13 NOTE — Progress Notes (Signed)
MOBILITY TEAM - Progress Note   09/13/19 1000  Mobility  Activity Ambulated in hall  Level of Assistance Independent  Assistive Device None  Distance Ambulated (ft) 800 ft  Mobility Response Tolerated well  Mobility performed by Mobility specialist   Pt moving well, denies pain. Discussed gentle LE ROM/stretching. Encouraged continued hallway ambulation. Pt motivated to mobilize.  Ina Homes, PT, DPT Mobility Team Pager [phone]

## 2019-09-13 NOTE — Progress Notes (Addendum)
  Progress Note    09/13/2019 7:13 AM 1 Day Post-Op  Subjective:  No complaints; says he had a good night  Afebrile HR 60's-80's NSR 110's-140's systolic 97% RA  Vitals:   09/12/19 2359 09/13/19 0423  BP: (!) 115/93 121/83  Pulse: 64 73  Resp: (!) 21 18  Temp: 98.5 F (36.9 C) 98.4 F (36.9 C)  SpO2: 98% 97%    Physical Exam: Cardiac:  regular Lungs:  Non labored Incisions:  Bilateral groins are clean and groins soft without hematoma Extremities:  Bilateral palpable DP/PT pulses Abdomen:  Soft, NT/ND  CBC    Component Value Date/Time   WBC 6.2 09/13/2019 0550   RBC 4.43 09/13/2019 0550   HGB 12.3 (L) 09/13/2019 0550   HCT 37.4 (L) 09/13/2019 0550   PLT 126 (L) 09/13/2019 0550   MCV 84.4 09/13/2019 0550   MCH 27.8 09/13/2019 0550   MCHC 32.9 09/13/2019 0550   RDW 14.5 09/13/2019 0550    BMET    Component Value Date/Time   NA 138 09/13/2019 0550   NA 138 03/08/2017 1236   K 3.9 09/13/2019 0550   CL 103 09/13/2019 0550   CO2 24 09/13/2019 0550   GLUCOSE 101 (H) 09/13/2019 0550   BUN 8 09/13/2019 0550   BUN 13 03/08/2017 1236   CREATININE 0.93 09/13/2019 0550   CALCIUM 9.0 09/13/2019 0550   GFRNONAA >60 09/13/2019 0550   GFRAA >60 09/13/2019 0550    INR    Component Value Date/Time   INR 1.2 09/12/2019 1110     Intake/Output Summary (Last 24 hours) at 09/13/2019 0713 Last data filed at 09/13/2019 0400 Gross per 24 hour  Intake 2144.02 ml  Output 4301 ml  Net -2156.98 ml     Assessment:  49 y.o. male is s/p:   #1: Endovascular repair of abdominal aortic aneurysm #2: Bilateral ultrasound-guided common femoral artery percutaneous access #3: Selective access into the left and right internal iliac artery with pelvic angiography  #4: Coil embolization of the left internal iliac artery #5: Abdominal aortogram  1 Day Post-Op  Plan: -pt doing well this morning; he has voided and ambulated in the room. -bilateral groins soft without  hematoma -normal renal function with good uop -pt for CT coronary, carotid duplex and BLE arterial duplex to evaluate for popliteal aneurysms -pt will f/u in 4 weeks with CTA    Doreatha Massed, PA-C [facility] [phone] 09/13/2019 7:13 AM  I agree with the above.  I have seen and evaluate the patient.  He is doing very well, postoperative day #1 from endovascular aneurysm repair.  His groin incisions are soft without hematoma.  He has palpable pedal pulses.  I have reviewed his duplex studies.  No significant carotid artery stenosis was identified.  He has triphasic pedal Doppler signals without significant popliteal aneurysm.  Final cardiac CTA results are pending.  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  He will need cardiac surgery follow-up with CT scan in 6 months.  I will see him back in the office in 1 month.  He will be started on a statin and aspirin prior to discharge.  We are working on getting him a primary care physician as well as insurance.  Durene Cal

## 2019-09-13 NOTE — TOC Transition Note (Addendum)
Transition of Care [facility]) - CM/SW Discharge Note   Patient Details  Name: Andre Morris MRN: [id number] Date of Birth: 1970/12/30  Transition of Care [facility]) CM/SW Contact:  Bess Kinds, RN Phone Number: [phone] 09/13/2019, 5:01 PM   Clinical Narrative:    Notified of patient discharge today. Medications sent to [facility] pharmacy with Match and override. Hospital f/u appointment scheduled with [facility] for 3/25 9:10 am.   Update: Spoke with patient and mom at bedside. Medications had already been delivered. Advised of hospital follow up appt next week, and verified information on AVS. No further TOC needs identified.   Final next level of care: Home w Home Health Services Barriers to Discharge: No Barriers Identified   Patient Goals and CMS Choice     Choice offered to / list presented to : NA  Discharge Placement                       Discharge Plan and Services                                     Social Determinants of Health (SDOH) Interventions     Readmission Risk Interventions No flowsheet data found.

## 2019-09-13 NOTE — Anesthesia Postprocedure Evaluation (Signed)
Anesthesia Post Note  Patient: Andre Morris  Procedure(s) Performed: ABDOMINAL AORTIC ENDOVASCULAR STENT GRAFT (N/A ) Coil embolization of left internal iliac artery (Left ) Ultrasound Guidance For Vascular Access     Patient location during evaluation: PACU Anesthesia Type: General Level of consciousness: awake and alert Pain management: pain level controlled Vital Signs Assessment: post-procedure vital signs reviewed and stable Respiratory status: spontaneous breathing, nonlabored ventilation, respiratory function stable and patient connected to nasal cannula oxygen Cardiovascular status: blood pressure returned to baseline and stable Postop Assessment: no apparent nausea or vomiting Anesthetic complications: no    Last Vitals:  Vitals:   09/13/19 0815 09/13/19 1155  BP: 122/82 114/86  Pulse: 69 66  Resp: (!) 22 18  Temp: 36.8 C 36.8 C  SpO2: 97% 98%    Last Pain:  Vitals:   09/13/19 1155  TempSrc: Oral  PainSc:                  [name]

## 2019-09-13 NOTE — Discharge Summary (Signed)
EVAR Discharge Summary   Andre Morris Aug 25, 1970 49 y.o. male  MRN: [id number]  Admission Date: 09/12/2019  Discharge Date: 09/13/2019  Physician: No att. providers found  Admission Diagnosis: Abdominal aortic aneurysm (AAA) ([place]) [I71.4] AAA (abdominal aortic aneurysm) ([place]) [I71.4]   HPI:   This is a 49 y.o. male who came to urgent care for a physical exam for work and was found to have a pulsatile mass.  He was sent to the ER for further evaluation.  A CTA revealed a 6.6 cm AAA.  He does not have any abdominal pain.  The patient denies any history of stroke or claudication.  He says he does have a family history of aneurysmal disease in his uncle.  There is no significant cardiac history.  The patient does smoke.  He is a Administrator.  His only other past medical history is Bell's palsy.  Hospital Course:  The patient was admitted to the hospital and taken to the operating room on 09/12/2019 and underwent: #1: Endovascular repair of abdominal aortic aneurysm #2: Bilateral ultrasound-guided common femoral artery percutaneous access #3: Selective access into the left and right internal iliac artery with pelvic angiography  #4: Coil embolization of the left internal iliac artery #5: Abdominal aortogram     Findings: Complete exclusion.  Successful embolization of left hypogastric artery.  The original plan was for a iliac branch device on the right however upon additional imaging, a proximal branch was visualized in the right internal iliac artery which was prohibitive for iliac branch device.  Therefore I elected to place a bell bottom device landing in the distal right common iliac artery.  A total of 151 cc of contrast was utilized.  The pt tolerated the procedure well and was transported to the PACU in good condition.   By POD 1, pt was doing well.  Bilateral groins soft without hematoma, he had ambulated.  Renal function normal.  He had several different tests given  his AAA.   No significant carotid artery stenosis was identified.  He has triphasic pedal Doppler signals without significant popliteal aneurysm.  Final cardiac CTA results are pending.  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  He will need cardiac surgery follow-up with CT scan in 6 months.  Care management was consulted to assist with medications and insurance needs.  He was scheduled for f/u appt with PCP.   His d/c meds were ordered through the transition of care pharmacy and delivered prior to discharge.   The remainder of the hospital course consisted of increasing mobilization and increasing intake of solids without difficulty.  CBC    Component Value Date/Time   WBC 6.2 09/13/2019 0550   RBC 4.43 09/13/2019 0550   HGB 12.3 (L) 09/13/2019 0550   HCT 37.4 (L) 09/13/2019 0550   PLT 126 (L) 09/13/2019 0550   MCV 84.4 09/13/2019 0550   MCH 27.8 09/13/2019 0550   MCHC 32.9 09/13/2019 0550   RDW 14.5 09/13/2019 0550    BMET    Component Value Date/Time   NA 138 09/13/2019 0550   NA 138 03/08/2017 1236   K 3.9 09/13/2019 0550   CL 103 09/13/2019 0550   CO2 24 09/13/2019 0550   GLUCOSE 101 (H) 09/13/2019 0550   BUN 8 09/13/2019 0550   BUN 13 03/08/2017 1236   CREATININE 0.93 09/13/2019 0550   CALCIUM 9.0 09/13/2019 0550   GFRNONAA >60 09/13/2019 0550   GFRAA >60 09/13/2019 0550  Discharge Instructions    Discharge patient   Complete by: As directed    Discharge pt once he has his meds and PCP appt and financial counselor has seen him.  Thanks   Discharge disposition: 01-Home or Self Care   Discharge patient date: 09/13/2019      Discharge Diagnosis:  Abdominal aortic aneurysm (AAA) ([facility]) [I71.4] AAA (abdominal aortic aneurysm) ([facility]) [I71.4]  Secondary Diagnosis: Patient Active Problem List   Diagnosis Date Noted  . Abdominal aortic aneurysm (AAA) ([facility]) 09/12/2019  . AAA (abdominal aortic aneurysm) ([facility]) 09/12/2019  . Routine general medical examination at  a health care facility 04/27/2015   Past Medical History:  Diagnosis Date  . Bell's palsy 04/2016  . GERD (gastroesophageal reflux disease)      Allergies as of 09/13/2019   No Known Allergies     Medication List    TAKE these medications   aspirin 81 MG EC tablet Take 1 tablet (81 mg total) by mouth daily at 6 (six) AM.   oxyCODONE 5 MG immediate release tablet Commonly known as: Oxy IR/ROXICODONE Take 1-2 tablets (5-10 mg total) by mouth every 4 (four) hours as needed for moderate pain.   rosuvastatin 10 MG tablet Commonly known as: CRESTOR Take 1 tablet (10 mg total) by mouth daily at 6 PM.   varenicline 0.5 MG X 11 & 1 MG X 42 tablet Commonly known as: Chantix Starting Month Pak Take one 0.5 mg tablet by mouth once daily for 3 days, then increase to one 0.5 mg tablet twice daily for 4 days, then increase to one 1 mg tablet twice daily.       Discharge Instructions:  [facility]  Discharge Instructions Endovascular Aortic Aneurysm Repair  Please refer to the following instructions for your post-procedure care. Your surgeon or Physician Assistant will discuss any changes with you.  Activity  You are encouraged to walk as much as you can. You can slowly return to normal activities but must avoid strenuous activity and heavy lifting until your doctor tells you it's OK. Avoid activities such as vacuuming or swinging a gold club. It is normal to feel tired for several weeks after your surgery. Do not drive until your doctor gives the OK and you are no longer taking prescription pain medications. It is also normal to have difficulty with sleep habits, eating, and bowel movements after surgery. These will go away with time.  Bathing/Showering  You may shower after you go home. If you have an incision, do not soak in a bathtub, hot tub, or swim until the incision heals completely.  Incision Care  Shower every day. Clean your incision with  mild soap and water. Pat the area dry with a clean towel. You do not need a bandage unless otherwise instructed. Do not apply any ointments or creams to your incision. If you clothing is irritating, you may cover your incision with a dry gauze pad.  Diet  Resume your normal diet. There are no special food restrictions following this procedure. A low fat/low cholesterol diet is recommended for all patients with vascular disease. In order to heal from your surgery, it is CRITICAL to get adequate nutrition. Your body requires vitamins, minerals, and protein. Vegetables are the best source of vitamins and minerals. Vegetables also provide the perfect balance of protein. Processed food has little nutritional value, so try to avoid this.  Medications  Resume taking all of your medications unless your doctor or Physician  Assistnat tells you not to. If your incision is causing pain, you may take over-the-counter pain relievers such as acetaminophen (Tylenol). If you were prescribed a stronger pain medication, please be aware these medications can cause nausea and constipation. Prevent nausea by taking the medication with a snack or meal. Avoid constipation by drinking plenty of fluids and eating foods with a high amount of fiber, such as fruits, vegetables, and grains.  Do not take Tylenol if you are taking prescription pain medications.   Follow up  Our office will schedule a follow-up appointment with a C.T. scan 3-4 weeks after your surgery.  Please call us immediately for any of the following conditions  . Severe or worsening pain in your legs or feet or in your abdomen back or chest. . Increased pain, redness, drainage (pus) from your incision sit. . Increased abdominal pain, bloating, nausea, vomiting or persistent diarrhea. . Fever of 101 degrees or higher. . Swelling in your leg (s), .  Reduce your risk of vascular disease  .Stop smoking. If you would like help call [facility] at  1-800-QUIT-NOW (802-528-6724) or [place] at [phone]. .Manage your cholesterol .Maintain a desired weight .Control your diabetes .Keep your blood pressure down  If you have questions, please call the office at [phone].   Prescriptions given: 1.  Roxicet #8 No Refill 2.  Crestor 10mg  #30 three refills 3.  Aspirin 81mg  OTC  Disposition: home  Patient's condition: is Good  Follow up: 1. Dr. in 4 weeks with CTA protocol   , PA-C [facility] [phone] 09/15/2019  10:44 AM   - For VQI Registry use - Post-op:  Time to Extubation: [x]  In OR, [ ]  < 12 hrs, [ ]  12-24 hrs, [ ]  >=24 hrs Vasopressors Req. Post-op: No MI: No., [ ]  Troponin only, [ ]  EKG or Clinical New Arrhythmia: No CHF: No ICU Stay: 1 day in progressive unit Transfusion: No     If yes, n/a units given  Complications: Resp failure: No., [ ]  Pneumonia, [ ]  Ventilator Chg in renal function: No., [ ]  Inc. Cr > 0.5, [ ]  Temp. Dialysis,  [ ]  Permanent dialysis Leg ischemia: No., no Surgery needed, [ ]  Yes, Surgery needed,  [ ]  Amputation Bowel ischemia: No., [ ]  Medical Rx, [ ]  Surgical Rx Wound complication: No., [ ]  Superficial separation/infection, [ ]  Return to OR Return to OR: No  Return to OR for bleeding: No Stroke: No., [ ]  Minor, [ ]  Major  Discharge medications: Statin use:  Yes  ASA use:  Yes  Plavix use:  No  Beta blocker use:  No  ARB use:  No ACEI use:  No CCB use:  No

## 2019-09-13 NOTE — Progress Notes (Signed)
D/C IVs and tele. Went over AVS with pt and family members and all questions were answered. Sent AVS packet and medicines home with pt. Waiting for transport.   Lawson Radar, RN

## 2019-09-17 ENCOUNTER — Other Ambulatory Visit: Payer: Self-pay

## 2019-09-17 DIAGNOSIS — I714 Abdominal aortic aneurysm, without rupture, unspecified: Secondary | ICD-10-CM

## 2019-09-18 ENCOUNTER — Encounter: Payer: Self-pay | Admitting: *Deleted

## 2019-09-18 NOTE — Progress Notes (Signed)
Patient ID: Andre Morris, male   DOB: 15-Jan-1971, 49 y.o.   MRN: [id number] Virtual Visit via Telephone Note  I connected with Andre Morris on 09/19/19 at  9:10 AM EDT by telephone and verified that I am speaking with the correct person using two identifiers.   I discussed the limitations, risks, security and privacy concerns of performing an evaluation and management service by telephone and the availability of in person appointments. I also discussed with the patient that there may be a patient responsible charge related to this service. The patient expressed understanding and agreed to proceed.  PATIENT visit by telephone virtually in the context of Covid-19 pandemic. Patient location: home My Location:  [facility] office Persons on the call:  Me and the patient    History of Present Illness: After hospitalization 3/18-3/19/2021 after being found to have a pulsatile mass in his abdomen on exam at [facility].  He was asymptomatic.  He went through the ED and was admitted for surgical repair.  No fever.  No N/V/D.  Taking it easy overall.  Not tolerating crestor-it was casuing him body aches.  He does not want to take cholesterol meds at this time.  It looks as though his insurance won't cover lipitor or pravastatin.  He wants to work on diet and has not resumed smoking.  No dizziness.  Feeling good overall and feels he is getting stronger daily.  He is able to do ADL.  Does not need any Rx/was able to get crestor but not taking now.    From discharge summary: Admission Diagnosis: Abdominal aortic aneurysm (AAA) ([facility]) [I71.4] AAA (abdominal aortic aneurysm) ([facility]) [I71.4]   HPI:   This is a 49 y.o. male whocame to urgent care for a physical exam for work and was found to have a pulsatile mass. He was sent to the ER for further evaluation. A CTA revealed a 6.6 cm AAA. He does not have any abdominal pain.  The patient denies any history of stroke or claudication. He says he does have a  family history of aneurysmal disease in his uncle. There is no significant cardiac history. The patient does smoke. He is a Naval architect. His only other past medical history is Bell's palsy.  Hospital Course:  The patient was admitted to the hospital and taken to the operating room on 09/12/2019 and underwent: #1: Endovascular repair of abdominal aortic aneurysm #2: Bilateral ultrasound-guided common femoral artery percutaneous access #3: Selective access into the left and right internal iliac artery with pelvic angiography  #4: Coil embolization of the left internal iliac artery #5: Abdominal aortogram    Findings: Complete exclusion. Successful embolization of left hypogastric artery. The original plan was for a iliac branch device on the right however upon additional imaging, a proximal branch was visualized in the right internal iliac artery which was prohibitive for iliac branch device. Therefore I elected to place a bell bottom device landing in the distal right common iliac artery. A total of 151 cc of contrast was utilized.  The pt tolerated the procedure well and was transported to the PACU in good condition.   By POD 1, pt was doing well.  Bilateral groins soft without hematoma, he had ambulated.  Renal function normal.  He had several different tests given his AAA.   No significant carotid artery stenosis was identified. He has triphasic pedal Doppler signals without significant popliteal aneurysm. Final cardiac CTA results are pending. He does have a 4.5 cm sinus of  Valsalva. He will need cardiac surgery follow-up with CT scan in 6 months.  Care management was consulted to assist with medications and insurance needs.  He was scheduled for f/u appt with PCP.   His d/c meds were ordered through the transition of care pharmacy and delivered prior to discharge.   The remainder of the hospital course consisted of increasing mobilization and increasing intake of solids  without difficulty.   Observations/Objective:  NAD.  A&Ox3   Assessment and Plan: 1. Abdominal aortic aneurysm (AAA) without rupture [facility]) S/p repair; keep appt with [facility] 10/14/2019 as scheduled patient aware and verbalized appt back to me  2. Hospital discharge follow-up Doing well.  Instructed to ED if any pain or SOB/dizziness/weakness  4. Elevated cholesterol-last checked 2018 Total=213, LDL=124.  He will work on diet and not resume crestor for now.  His lipids can be checked in a few weeks and make decision from there based on progress.    Follow Up Instructions: Assign PCP in 4-6 weeks   I discussed the assessment and treatment plan with the patient. The patient was provided an opportunity to ask questions and all were answered. The patient agreed with the plan and demonstrated an understanding of the instructions.   The patient was advised to call back or seek an in-person evaluation if the symptoms worsen or if the condition fails to improve as anticipated.  I provided 14 minutes of non-face-to-face time during this encounter.   Freeman Caldron, PA-C

## 2019-09-19 ENCOUNTER — Other Ambulatory Visit: Payer: Self-pay

## 2019-09-19 ENCOUNTER — Ambulatory Visit: Payer: Self-pay | Attending: Family Medicine | Admitting: Physician Assistant

## 2019-09-19 DIAGNOSIS — Z09 Encounter for follow-up examination after completed treatment for conditions other than malignant neoplasm: Secondary | ICD-10-CM

## 2019-09-19 DIAGNOSIS — I714 Abdominal aortic aneurysm, without rupture, unspecified: Secondary | ICD-10-CM

## 2019-09-19 DIAGNOSIS — E7800 Pure hypercholesterolemia, unspecified: Secondary | ICD-10-CM

## 2019-10-09 ENCOUNTER — Other Ambulatory Visit: Payer: Self-pay | Admitting: *Deleted

## 2019-10-09 DIAGNOSIS — I714 Abdominal aortic aneurysm, without rupture, unspecified: Secondary | ICD-10-CM

## 2019-10-10 ENCOUNTER — Other Ambulatory Visit: Payer: Self-pay

## 2019-10-14 ENCOUNTER — Ambulatory Visit (INDEPENDENT_AMBULATORY_CARE_PROVIDER_SITE_OTHER): Payer: Self-pay | Admitting: Surgery

## 2019-10-14 ENCOUNTER — Encounter: Payer: Self-pay | Admitting: Surgery

## 2019-10-14 ENCOUNTER — Other Ambulatory Visit: Payer: Self-pay

## 2019-10-14 ENCOUNTER — Ambulatory Visit (HOSPITAL_COMMUNITY)
Admission: RE | Admit: 2019-10-14 | Discharge: 2019-10-14 | Disposition: A | Discharge status: Home / Self Care | Payer: Self-pay | Source: Ambulatory Visit | Attending: Surgery | Admitting: Surgery

## 2019-10-14 VITALS — BP 122/87 | HR 75 | Temp 97.7°F | Resp 20 | Ht 71.0 in | Wt 192.0 lb

## 2019-10-14 DIAGNOSIS — I714 Abdominal aortic aneurysm, without rupture, unspecified: Secondary | ICD-10-CM

## 2019-10-14 NOTE — Progress Notes (Signed)
   Patient name: Andre Morris MRN: [id number] DOB: [birth year] Sex: male  REASON FOR VISIT:     post op  HISTORY OF PRESENT ILLNESS:   AADAM ZHEN is a 49 y.o. male, who came to urgent care for a physical exam for work and was found to have a pulsatile mass.  He was sent to the ER for further evaluation.  A CTA revealed a 6.6 cm AAA.  He underwent EVAR on 09-12-2019.  Coronary CTA showed calcium score of 177. 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  He says he does have a family history of aneurysmal disease in his uncle.  There is no significant cardiac history.  The patient does smoke.  He is a Naval architect.  His only other past medical history is Bell's palsy.  CURRENT MEDICATIONS:    Current Outpatient Medications  Medication Sig Dispense Refill  . aspirin EC 81 MG EC tablet Take 1 tablet (81 mg total) by mouth daily at 6 (six) AM. (Patient not taking: Reported on 10/14/2019)     No current facility-administered medications for this visit.    REVIEW OF SYSTEMS:   [X]  denotes positive finding, [ ]  denotes negative finding Cardiac  Comments:  Chest pain or chest pressure:    Shortness of breath upon exertion:    Short of breath when lying flat:    Irregular heart rhythm:    Constitutional    Fever or chills:      PHYSICAL EXAM:   Vitals:   10/14/19 0836  BP: 122/87  Pulse: 75  Resp: 20  Temp: 97.7 F (36.5 C)  SpO2: 98%  Weight: 192 lb (87.1 kg)  Height: 5\' 11"  (1.803 m)    GENERAL: The patient is a well-nourished male, in no acute distress. The vital signs are documented above. CARDIOVASCULAR: There is a regular rate and rhythm. PULMONARY: Non-labored respirations Incisions well healed  STUDIES:   EVAR DUPLEX: Abdominal Aorta: Patent endovascular aneurysm repair with no evidence of  endoleak. The largest aortic diameter has decreased compared to prior    exam. Previous diameter measurement was 6.6 cm obtained on  Max diameter:  6.52   MEDICAL ISSUES:   AAA: For financial reasons, the patient ended up getting a ultrasound today.  This shows no evidence of endoleak with decrease in his maximum aortic diameter to 6.52 cm.  He will follow-up in 6 months with a repeat ultrasound.  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  He will likely need a cardiac echo to evaluate for regurgitation.  I am getting refer him to CT surgery for further management.  , MD, FACS [facility] [phone] Pager [phone]

## 2019-10-16 ENCOUNTER — Other Ambulatory Visit: Payer: Self-pay | Admitting: *Deleted

## 2019-10-16 DIAGNOSIS — I714 Abdominal aortic aneurysm, without rupture, unspecified: Secondary | ICD-10-CM

## 2019-10-17 ENCOUNTER — Encounter: Payer: Self-pay | Admitting: Cardiology

## 2019-10-17 ENCOUNTER — Ambulatory Visit (INDEPENDENT_AMBULATORY_CARE_PROVIDER_SITE_OTHER): Payer: Self-pay | Admitting: Cardiology

## 2019-10-17 ENCOUNTER — Other Ambulatory Visit: Payer: Self-pay

## 2019-10-17 VITALS — BP 140/78 | HR 68 | Temp 98.1°F | Ht 71.0 in | Wt 192.0 lb

## 2019-10-17 DIAGNOSIS — E785 Hyperlipidemia, unspecified: Secondary | ICD-10-CM

## 2019-10-17 DIAGNOSIS — I77810 Thoracic aortic ectasia: Secondary | ICD-10-CM

## 2019-10-17 DIAGNOSIS — Z7189 Other specified counseling: Secondary | ICD-10-CM

## 2019-10-17 DIAGNOSIS — Z716 Tobacco abuse counseling: Secondary | ICD-10-CM

## 2019-10-17 DIAGNOSIS — I714 Abdominal aortic aneurysm, without rupture, unspecified: Secondary | ICD-10-CM

## 2019-10-17 LAB — LIPID PANEL
Chol/HDL Ratio: 2.4 ratio (ref 0.0–5.0)
Cholesterol, Total: 202 mg/dL — ABNORMAL HIGH (ref 100–199)
HDL: 85 mg/dL (ref >39)
LDL Chol Calc (NIH): 101 mg/dL — ABNORMAL HIGH (ref 0–99)
Triglycerides: 89 mg/dL (ref 0–149)
VLDL Cholesterol Cal: 16 mg/dL (ref 5–40)

## 2019-10-17 NOTE — Patient Instructions (Signed)
Medication Instructions:  Your Physician recommend you continue on your current medication as directed.    *If you need a refill on your cardiac medications before your next appointment, please call your pharmacy*   Lab Work: Your physician recommends that you return for lab work today (Lipid)  If you have labs (blood work) drawn today and your tests are completely normal, you will receive your results only by: Marland Kitchen MyChart Message (if you have MyChart) OR . A paper copy in the mail If you have any lab test that is abnormal or we need to change your treatment, we will call you to review the results.   Testing/Procedures: Your physician has requested that you have an echocardiogram. Echocardiography is a painless test that uses sound waves to create images of your heart. It provides your doctor with information about the size and shape of your heart and how well your heart's chambers and valves are working. This procedure takes approximately one hour. There are no restrictions for this procedure. [address]    Follow-Up: At [facility], you and your health needs are our priority.  As part of our continuing mission to provide you with exceptional heart care, we have created designated Provider Care Teams.  These Care Teams include your primary Cardiologist (physician) and Advanced Practice Providers (APPs -  Physician Assistants and Nurse Practitioners) who all work together to provide you with the care you need, when you need it.  We recommend signing up for the patient portal called "MyChart".  Sign up information is provided on this After Visit Summary.  MyChart is used to connect with patients for Virtual Visits (Telemedicine).  Patients are able to view lab/test results, encounter notes, upcoming appointments, etc.  Non-urgent messages can be sent to your provider as well.   To learn more about what you can do with MyChart, go to [email or web address].    Your  next appointment:   6 month(s)  The format for your next appointment:   In Person  Provider:   Jodelle Red, MD

## 2019-10-17 NOTE — Progress Notes (Signed)
Cardiology Office Note:    Date:  10/17/2019   ID:  Andre Morris, DOB [birth year], MRN [id number]  PCP:  Patient, No Pcp Per  Cardiologist:  Jodelle Red, MD  Referring MD: Nada Libman, MD   CC: new patient evaluation for dilated aortic root/sinuses of valsalva  History of Present Illness:    Andre Morris is a 49 y.o. male with a hx of AAA s/p EVAR, noted dilated aortic root who is seen as a new consult at the request of Nada Libman, MD for the evaluation and management of dilated aortic root.  Reviewed note from Dr. Myra Gianotti 10/14/19. He was noted to have dilated aortic root at the level of the sinus. Referred to cardiology  Has been doing well since his vascular surgery. Sore for a few days, but since then has felt well. Looking forward to getting back to work. Rarely feels chest discomfort, felt some yesterday. Has had this in the past as well. Dull or sharp pain, mild, almost like a numbness. Notices when he drives most of the time. Occasional feeling of legs wanting to give out. No syncope. No associated symptoms. Same pain has he had in 05/2018 in the ER, evaluation was unremarkable.  Cardiovascular risk factors: Prior clinical ASCVD: AAA s/p repair Comorbid conditions, including hypertension, hyperlipidemia, diabetes, chronic kidney disease: may have been told he has high cholesterol, not sure. Denies all else Metabolic syndrome/Obesity: none Chronic inflammatory conditions: none Tobacco use history: currently smoking both marijuana and cigarettes. Smokes about 1 pack every 1.5 days. Has smoked for about 25 years. Attempted to quit on multiple occasions, never succeed for a long period of time. Family history: maternal uncle with a history of aneurysm, diagnosed around age 39. Treated, did well but deceased from lung cancer. Maternal grandmother with heart failure. No heart attacks or strokes that they know of. Not much known about paternal side. Prior  cardiac testing and/or incidental findings on other testing (ie coronary calcium): reviewed CTA Exercise level: no limitations, able to walk, climb stairs, etc. Works as a [employer] architect, so largely sedentary Current diet: had changed over the last year, trying to eat more vegetables, fruits, etc. Avoiding meat.  Thinks he was on rosuvastatin 10 mg in the past, hasn't taken in about a month. Muscle aches, headaches caused him to stop. Does not think he has tried another.  Past Medical History:  Diagnosis Date  . AAA (abdominal aortic aneurysm) (HCC)   . Bell's palsy 04/2016  . GERD (gastroesophageal reflux disease)     Past Surgical History:  Procedure Laterality Date  . ABDOMINAL AORTIC ENDOVASCULAR STENT GRAFT N/A 09/12/2019   Procedure: ABDOMINAL AORTIC ENDOVASCULAR STENT GRAFT;  Surgeon: Nada Libman, MD;  [facility];  Service: Vascular;  Laterality: N/A;  . EMBOLIZATION Left 09/12/2019   Procedure: Coil embolization of left internal iliac artery;  Surgeon: Nada Libman, MD;  Location: [facility] OR;  Service: Vascular;  Laterality: Left;  . ULTRASOUND GUIDANCE FOR VASCULAR ACCESS  09/12/2019   Procedure: Ultrasound Guidance For Vascular Access;  Surgeon: Nada Libman, MD;  Location: [facility] OR;  Service: Vascular;;    Current Medications: Current Outpatient Medications on File Prior to Visit  Medication Sig  . aspirin EC 81 MG EC tablet Take 1 tablet (81 mg total) by mouth daily at 6 (six) AM. (Patient not taking: Reported on 10/14/2019)   No current facility-administered medications on file prior to visit.     Allergies:  Patient has no known allergies.   Social History   Tobacco Use  . Smoking status: Current Some Day Smoker    Packs/day: 0.00    Years: 20.00    Pack years: 0.00    Types: Cigarettes  . Smokeless tobacco: Never Used  . Tobacco comment: 1- 2 cigs per week  Substance Use Topics  . Alcohol use: Yes    Alcohol/week: 3.0 standard drinks    Types: 3  Standard drinks or equivalent per week    Comment: Occasionanally   . Drug use: Yes    Frequency: 7.0 times per week    Types: Marijuana    Family History: family history includes Healthy in his maternal grandfather, paternal grandfather, and paternal grandmother; Heart disease in his maternal grandmother; Hypertension in his maternal grandmother and mother; Lung cancer in his father and maternal grandmother.  ROS:   Please see the history of present illness.  Additional pertinent ROS: Constitutional: Negative for chills, fever, night sweats, unintentional weight loss  HENT: Negative for ear pain and hearing loss.   Eyes: Negative for loss of vision and eye pain.  Respiratory: Negative for cough, sputum, wheezing.   Cardiovascular: See HPI. Gastrointestinal: Negative for abdominal pain, melena, and hematochezia.  Genitourinary: Negative for dysuria and hematuria.  Musculoskeletal: Negative for falls and myalgias.  Skin: Negative for itching and rash.  Neurological: Negative for focal weakness, focal sensory changes and loss of consciousness.  Endo/Heme/Allergies: Does not bruise/bleed easily.     EKGs/Labs/Other Studies Reviewed:    The following studies were reviewed today: CTA coronary 09/15/19 Aorta: Dilated aortic root at the level of coronary sinuses with maximum diameter 52 mm with mild central non-coaptation. Dilatation of sinotubular junction with maximum diameter 39 mm. Normal size of the ascending aorta. Minimal diffuse atherosclerotic plaque and calcifications and dissection.  Sinotubular Junction: 39 x 35 mm  Ascending Thoracic Aorta: 35 x 35 mm  Aortic Arch: Not visualized.  Descending Thoracic Aorta: 26 x 26 mm  Sinus of Valsalva Measurements:  Non-coronary: 43 mm  Right -coronary: 46 mm  Left -coronary: 45 mm  Aortic Valve:  Trileaflet.  No calcifications.  Coronary Arteries:  Normal coronary origin.  Right dominance.  RCA is a very  large lumen dominant artery that gives rise to PDA and PLA. There is minimal diffuse calcified plaque with stenosis 0-25%.  PDA is a large lumen artery with minimal calcified plaque with stenosis 0-25%.  PLA is small with no plaque.  Left main is a large lumen, short artery that gives rise to LAD and LCX arteries. Distal left main has minimal calcified plaque with stenosis 0-25%.  LAD is a large vessel that gives rise to two diagonal arteries. There is mild calcified plaque in the ostial and proximal LAD with stenosis 0-25%. Mid and distal LAD has only luminal irregularities.  D1,2 are medium size arteries with only luminal irregularities.  LCX is a non-dominant artery that gives rise to one large OM1 branch. There is mild calcified plaque in the proximal LCX artery with stenosis 25-49%.  Other findings:  Normal pulmonary vein drainage into the left atrium.  Normal left atrial appendage without a thrombus.  Normal size of the pulmonary artery.  IMPRESSION: 1. Coronary calcium score of 177. This was 95 percentile for age and sex matched control.  2. Normal coronary origin with right dominance.  3. CAD-RADS 2. Mild non-obstructive CAD (25-49%) in the proximal portion of a non-dominant LCX artery, otherwise only minimal diffuse plaque.  Consider non-atherosclerotic causes of chest pain. Consider preventive therapy and risk factor modification.  4. Aorta: Dilated aortic root at the level of coronary sinuses with maximum diameter 52 mm with mild central non-coaptation. An echocardiogram is recommended for evaluation of a possible aortic regurgitation.  Dilatation of sinotubular junction with maximum diameter 39 mm. Normal size of the ascending aorta. Minimal diffuse atherosclerotic plaque and calcifications and dissection.   EKG:  EKG is personally reviewed.  The ekg ordered today demonstrates sinus rhythm, first degree AV block, borderline LVH  Recent  Labs: 09/03/2019: ALT 12 09/12/2019: Magnesium 2.0 09/13/2019: BUN 8; Creatinine, Ser 0.93; Hemoglobin 12.3; Platelets 126; Potassium 3.9; Sodium 138  Recent Lipid Panel    Component Value Date/Time   CHOL 213 (H) 03/08/2017 1236   TRIG 93 03/08/2017 1236   HDL 70 03/08/2017 1236   CHOLHDL 3.0 03/08/2017 1236   CHOLHDL 3 04/27/2015 0856   VLDL 25.8 04/27/2015 0856   LDLCALC 124 (H) 03/08/2017 1236    Physical Exam:    VS:  BP 140/78   Pulse 68   Temp 98.1 F (36.7 C)   Ht 5\' 11"  (1.803 m)   Wt 192 lb (87.1 kg)   SpO2 98%   BMI 26.78 kg/m     Wt Readings from Last 3 Encounters:  10/17/19 192 lb (87.1 kg)  10/14/19 192 lb (87.1 kg)  09/12/19 184 lb (83.5 kg)    GEN: Well nourished, well developed in no acute distress HEENT: Normal, moist mucous membranes NECK: No JVD CARDIAC: regular rhythm, normal S1 and S2, no rubs or gallops. No murmurs. VASCULAR: Radial and DP pulses 2+ bilaterally. No carotid bruits RESPIRATORY:  Clear to auscultation without rales, wheezing or rhonchi  ABDOMEN: Soft, non-tender, non-distended MUSCULOSKELETAL:  Ambulates independently SKIN: Warm and dry, no edema NEUROLOGIC:  Alert and oriented x 3. No focal neuro deficits noted. PSYCHIATRIC:  Normal affect    ASSESSMENT:    1. Aortic root dilatation (HCC)   2. Abdominal aortic aneurysm (AAA) without rupture (HCC)   3. Hyperlipidemia, unspecified hyperlipidemia type   4. Cardiac risk counseling   5. Counseling on health promotion and disease prevention   6. Tobacco abuse counseling    PLAN:    History of AAA s/p EVAR, found to have aortic root dilation/sinus of Valsalva dilation on CT: -will get echo to evaluate for aortic regurgitation -discussed important of tobacco cessation, blood pressure control and CV risk modification  Hyperlipidemia: last labs from 2018. With vascular disease, LDL goal <70 -check lipids today -we discussed the data on statins, both in terms of their long term  benefit as well as the risk of side effects. Reviewed common misconceptions about statins. Reviewed how we monitor treatment.  -I recommended statin regardless of lipids given vascular disease, but will check lipids to determine how intensely it needs to be treated  Tobacco abuse, tobacco cessation counseling: The patient was counseled on tobacco cessation today for 5 minutes.  Counseling included reviewing the risks of smoking tobacco products, how it impacts the patient's current medical diagnoses and different strategies for quitting.  Pharmacotherapy to aid in tobacco cessation was not prescribed today.  Cardiac risk counseling and prevention recommendations: -recommend heart healthy/Mediterranean diet, with whole grains, fruits, vegetable, fish, lean meats, nuts, and olive oil. Limit salt. -recommend moderate walking, 3-5 times/week for 30-50 minutes each session. Aim for at least 150 minutes.week. Goal should be pace of 3 miles/hours, or walking 1.5 miles in 30 minutes -recommend avoidance  of tobacco products. Avoid excess alcohol. -Additional risk factor control:  -Diabetes risk: A1c is last 5.5 in 2018, denies history  -Lipids: as above  -Blood pressure control: slightly elevated today. Visit 4/19 was 122/87. Monitor  -Weight: BMI 26 -ASCVD risk score: The 10-year ASCVD risk score Mikey Bussing DC Brooke Bonito., et al., 2013) is: 8.8%   Values used to calculate the score:     Age: 74 years     Sex: Male     Is Non-Hispanic African American: Yes     Diabetic: No     Tobacco smoker: Yes     Systolic Blood Pressure: 789 mmHg     Is BP treated: No     HDL Cholesterol: 70 mg/dL     Total Cholesterol: 213 mg/dL    Plan for follow up: 6 mos or sooner as needed, unless echo markedly abnormal.  Buford Dresser, MD, PhD   [facility]    Medication Adjustments/Labs and Tests Ordered: Current medicines are reviewed at length with the patient today.  Concerns regarding medicines are  outlined above.  Orders Placed This Encounter  Procedures  . Lipid panel  . EKG 12-Lead  . ECHOCARDIOGRAM COMPLETE   No orders of the defined types were placed in this encounter.   Patient Instructions  Medication Instructions:  Your Physician recommend you continue on your current medication as directed.    *If you need a refill on your cardiac medications before your next appointment, please call your pharmacy*   Lab Work: Your physician recommends that you return for lab work today (Lipid)  If you have labs (blood work) drawn today and your tests are completely normal, you will receive your results only by: Marland Kitchen MyChart Message (if you have MyChart) OR . A paper copy in the mail If you have any lab test that is abnormal or we need to change your treatment, we will call you to review the results.   Testing/Procedures: Your physician has requested that you have an echocardiogram. Echocardiography is a painless test that uses sound waves to create images of your heart. It provides your doctor with information about the size and shape of your heart and how well your heart's chambers and valves are working. This procedure takes approximately one hour. There are no restrictions for this procedure. [address]    Follow-Up: At [facility], you and your health needs are our priority.  As part of our continuing mission to provide you with exceptional heart care, we have created designated Provider Care Teams.  These Care Teams include your primary Cardiologist (physician) and Advanced Practice Providers (APPs -  Physician Assistants and Nurse Practitioners) who all work together to provide you with the care you need, when you need it.  We recommend signing up for the patient portal called "MyChart".  Sign up information is provided on this After Visit Summary.  MyChart is used to connect with patients for Virtual Visits (Telemedicine).  Patients are able to view  lab/test results, encounter notes, upcoming appointments, etc.  Non-urgent messages can be sent to your provider as well.   To learn more about what you can do with MyChart, go to [email or web address].    Your next appointment:   6 month(s)  The format for your next appointment:   In Person  Provider:   Buford Dresser, MD      Signed, Buford Dresser, MD PhD 10/17/2019 12:50 PM    [place]

## 2019-10-18 ENCOUNTER — Encounter: Payer: Self-pay | Admitting: Cardiology

## 2019-10-28 ENCOUNTER — Telehealth (INDEPENDENT_AMBULATORY_CARE_PROVIDER_SITE_OTHER): Payer: Self-pay | Admitting: Cardiology

## 2019-10-28 VITALS — Ht 71.0 in | Wt 178.1 lb

## 2019-10-28 DIAGNOSIS — Z7189 Other specified counseling: Secondary | ICD-10-CM

## 2019-10-28 DIAGNOSIS — I77810 Thoracic aortic ectasia: Secondary | ICD-10-CM | POA: Insufficient documentation

## 2019-10-28 DIAGNOSIS — I714 Abdominal aortic aneurysm, without rupture, unspecified: Secondary | ICD-10-CM

## 2019-10-28 DIAGNOSIS — F17210 Nicotine dependence, cigarettes, uncomplicated: Secondary | ICD-10-CM

## 2019-10-28 DIAGNOSIS — E7800 Pure hypercholesterolemia, unspecified: Secondary | ICD-10-CM

## 2019-10-28 DIAGNOSIS — Z712 Person consulting for explanation of examination or test findings: Secondary | ICD-10-CM

## 2019-10-28 MED ORDER — PRAVASTATIN SODIUM 20 MG PO TABS: 20.0000 mg | ORAL_TABLET | Freq: Every evening | ORAL | 11 refills | Status: DC

## 2019-10-28 NOTE — Progress Notes (Signed)
Virtual Visit via Video Note   This visit type was conducted due to national recommendations for restrictions regarding the COVID-19 Pandemic (e.g. social distancing) in an effort to limit this patient's exposure and mitigate transmission in our community.  Due to his co-morbid illnesses, this patient is at least at moderate risk for complications without adequate follow up.  This format is felt to be most appropriate for this patient at this time.  All issues noted in this document were discussed and addressed.  A limited physical exam was performed with this format.  Please refer to the patient's chart for his consent to telehealth for [facility].   The patient was identified using 2 identifiers.  Date:  10/28/2019   ID:  Andre Morris, DOB 08-20-1970, MRN [id number]  Patient Location: Home Provider Location: Home  PCP:  Patient, No Pcp Per  Cardiologist:  Buford Dresser, MD  Electrophysiologist:  None   Evaluation Performed:  Follow-Up Visit  Chief Complaint:  Review test results and discuss next steps  History of Present Illness:    Andre Morris is a 49 y.o. male with  a hx of AAA s/p EVAR, noted dilated aortic root.  The patient does not have symptoms concerning for COVID-19 infection (fever, chills, cough, or new shortness of breath).   He is feeling well, wants to know when he can go back to work. Discussed that as long as echo next week doesn't see severe abnormalities, it is fine to return to work from my perspective. He should discussed with Dr. 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  We spent time today discussing management. He has been working on diet changes. We discussed recommendations for statin, what they do, etc. He did not tolerate rosuvastatin in the past. Discussed alternatives. He prefers not to be on medication in the long term but is willing to try another statin. See  below.  Denies chest pain, shortness of breath at rest or with normal exertion. No PND, orthopnea, LE edema or unexpected weight gain. No syncope or palpitations.   Past Medical History:  Diagnosis Date  . AAA (abdominal aortic aneurysm) ([place])   . Bell's palsy 04/2016  . GERD (gastroesophageal reflux disease)    Past Surgical History:  Procedure Laterality Date  . ABDOMINAL AORTIC ENDOVASCULAR STENT GRAFT N/A 09/12/2019   Procedure: ABDOMINAL AORTIC ENDOVASCULAR STENT GRAFT;  Surgeon: Serafina Mitchell, MD;  Location: [facility] OR;  Service: Vascular;  Laterality: N/A;  . EMBOLIZATION Left 09/12/2019   Procedure: Coil embolization of left internal iliac artery;  Surgeon: Serafina Mitchell, MD;  Location: [place];  Service: Vascular;  Laterality: Left;  . ULTRASOUND GUIDANCE FOR VASCULAR ACCESS  09/12/2019   Procedure: Ultrasound Guidance For Vascular Access;  Surgeon: Serafina Mitchell, MD;  Location: [facility] OR;  Service: Vascular;;     Current Meds  Medication Sig  . aspirin EC 81 MG tablet Take 81 mg by mouth daily as needed.     Allergies:   Patient has no known allergies.   Social History   Tobacco Use  . Smoking status: Current Some Day Smoker    Packs/day: 0.00    Years: 20.00    Pack years: 0.00    Types: Cigarettes  . Smokeless tobacco: Never Used  . Tobacco comment: 1- 2 cigs per week  Substance Use Topics  . Alcohol use: Yes    Alcohol/week: 3.0 standard drinks  Types: 3 Standard drinks or equivalent per week    Comment: Occasionanally   . Drug use: Yes    Frequency: 7.0 times per week    Types: Marijuana     Family Hx: The patient's family history includes Healthy in his maternal grandfather, paternal grandfather, and paternal grandmother; Heart disease in his maternal grandmother; Hypertension in his maternal grandmother and mother; Lung cancer in his father and maternal grandmother.  ROS:   Please see the history of present illness.    All other systems reviewed and  are negative.   Prior CV studies:   The following studies were reviewed today: Echo pending  Labs/Other Tests and Data Reviewed:    EKG:  An ECG dated 10/17/19 was personally reviewed today and demonstrated:  SR, 1st degree AV block, borderline LVH  Recent Labs: 09/03/2019: ALT 12 09/12/2019: Magnesium 2.0 09/13/2019: BUN 8; Creatinine, Ser 0.93; Hemoglobin 12.3; Platelets 126; Potassium 3.9; Sodium 138   Recent Lipid Panel Lab Results  Component Value Date/Time   CHOL 202 (H) 10/17/2019 08:54 AM   TRIG 89 10/17/2019 08:54 AM   HDL 85 10/17/2019 08:54 AM   CHOLHDL 2.4 10/17/2019 08:54 AM   CHOLHDL 3 04/27/2015 08:56 AM   LDLCALC 101 (H) 10/17/2019 08:54 AM    Wt Readings from Last 3 Encounters:  10/28/19 178 lb 1.6 oz (80.8 kg)  10/17/19 192 lb (87.1 kg)  10/14/19 192 lb (87.1 kg)     Objective:    Vital Signs:  Ht 5\' 11"  (1.803 m)   Wt 178 lb 1.6 oz (80.8 kg)   BMI 24.84 kg/m    VITAL SIGNS:  reviewed GEN:  no acute distress EYES:  sclerae anicteric, EOMI - Extraocular Movements Intact RESPIRATORY:  normal respiratory effort, symmetric expansion CARDIOVASCULAR:  no visible JVD SKIN:  no rash, lesions or ulcers. MUSCULOSKELETAL:  no obvious deformities. NEURO:  alert and oriented x 3, no obvious focal deficit PSYCH:  normal affect  ASSESSMENT & PLAN:    History of AAA s/p EVAR, found to have aortic root dilation/sinus of Valsalva dilation on CT: -will get echo to evaluate for aortic regurgitation, which is pending -discussed important of tobacco cessation, blood pressure control and CV risk modification  Hypercholesterolemia: With vascular disease, LDL goal <70 -reviewed lipid results at length today. -we discussed the data on statins, both in terms of their long term benefit as well as the risk of side effects. Reviewed common misconceptions about statins. Reviewed how we monitor treatment.  -he is willing to trial a less intense statin, as he did not tolerate  rosuvastatin. Will trial pravastatin -recheck lipids/LFTs at follow up  Cardiac risk counseling and prevention recommendations: -recommend heart healthy/Mediterranean diet, with whole grains, fruits, vegetable, fish, lean meats, nuts, and olive oil. Limit salt. -recommend moderate walking, 3-5 times/week for 30-50 minutes each session. Aim for at least 150 minutes.week. Goal should be pace of 3 miles/hours, or walking 1.5 miles in 30 minutes -recommend avoidance of tobacco products. Avoid excess alcohol. -Additional risk factor control:             -Diabetes risk: A1c is last 5.5 in 2018, denies history             -Lipids: as above             -Blood pressure control: no vitals today. Visit 4/19 was 122/87. Monitor             -Weight: BMI 26  COVID-19 Education: The  signs and symptoms of COVID-19 were discussed with the patient and how to seek care for testing (follow up with PCP or arrange E-visit).  The importance of social distancing was discussed today.  Time:   Today, I have spent 15 minutes with the patient with telehealth technology discussing the above problems.    Patient Instructions  Medication Instructions:  Start Pravastatin 20 mg every evening  *If you need a refill on your cardiac medications before your next appointment, please call your pharmacy*   Lab Work: None   Testing/Procedures: None   Follow-Up: At [facility], you and your health needs are our priority.  As part of our continuing mission to provide you with exceptional heart care, we have created designated Provider Care Teams.  These Care Teams include your primary Cardiologist (physician) and Advanced Practice Providers (APPs -  Physician Assistants and Nurse Practitioners) who all work together to provide you with the care you need, when you need it.  We recommend signing up for the patient portal called "MyChart".  Sign up information is provided on this After Visit Summary.  MyChart is used to  connect with patients for Virtual Visits (Telemedicine).  Patients are able to view lab/test results, encounter notes, upcoming appointments, etc.  Non-urgent messages can be sent to your provider as well.   To learn more about what you can do with MyChart, go to [email or web address].    Your next appointment:   6 month(s)  The format for your next appointment:   In Person  Provider:   Jodelle Red, MD      Signed, Jodelle Red, MD  10/28/2019 10:20 AM     [facility]

## 2019-10-28 NOTE — Patient Instructions (Signed)
Medication Instructions:  Start Pravastatin 20 mg every evening  *If you need a refill on your cardiac medications before your next appointment, please call your pharmacy*   Lab Work: None   Testing/Procedures: None   Follow-Up: At [facility], you and your health needs are our priority.  As part of our continuing mission to provide you with exceptional heart care, we have created designated Provider Care Teams.  These Care Teams include your primary Cardiologist (physician) and Advanced Practice Providers (APPs -  Physician Assistants and Nurse Practitioners) who all work together to provide you with the care you need, when you need it.  We recommend signing up for the patient portal called "MyChart".  Sign up information is provided on this After Visit Summary.  MyChart is used to connect with patients for Virtual Visits (Telemedicine).  Patients are able to view lab/test results, encounter notes, upcoming appointments, etc.  Non-urgent messages can be sent to your provider as well.   To learn more about what you can do with MyChart, go to [email or web address].    Your next appointment:   6 month(s)  The format for your next appointment:   In Person  Provider:   Jodelle Red, MD

## 2019-10-29 ENCOUNTER — Encounter: Payer: Self-pay | Admitting: Cardiology

## 2019-11-04 ENCOUNTER — Other Ambulatory Visit: Payer: Self-pay

## 2019-11-04 ENCOUNTER — Ambulatory Visit (HOSPITAL_COMMUNITY): Payer: Self-pay | Attending: Cardiology

## 2019-11-04 DIAGNOSIS — I77810 Thoracic aortic ectasia: Secondary | ICD-10-CM | POA: Insufficient documentation

## 2019-11-11 ENCOUNTER — Encounter: Payer: Self-pay | Admitting: *Deleted

## 2019-11-11 NOTE — Telephone Encounter (Signed)
Pt updated with ECHO results and verbalized understanding.  

## 2020-03-16 ENCOUNTER — Ambulatory Visit (HOSPITAL_COMMUNITY)
Admission: RE | Admit: 2020-03-16 | Discharge: 2020-03-16 | Disposition: A | Discharge status: Home / Self Care | Payer: 59 | Source: Ambulatory Visit | Attending: Surgery | Admitting: Surgery

## 2020-03-16 ENCOUNTER — Other Ambulatory Visit: Payer: Self-pay

## 2020-03-16 ENCOUNTER — Ambulatory Visit (INDEPENDENT_AMBULATORY_CARE_PROVIDER_SITE_OTHER): Payer: 59 | Admitting: Physician Assistant

## 2020-03-16 VITALS — BP 134/83 | HR 69 | Temp 98.3°F | Resp 20 | Ht 71.0 in | Wt 210.0 lb

## 2020-03-16 DIAGNOSIS — I714 Abdominal aortic aneurysm, without rupture, unspecified: Secondary | ICD-10-CM

## 2020-03-16 NOTE — Progress Notes (Signed)
Office Note     CC:  follow up Requesting Provider:  No ref. provider found  HPI: Andre Morris is a 49 y.o. (1971-03-04) male who presents for 35-month follow-up endovascular repair of 6.6 cm AAA.  He underwent EVAR on September 12, 2019.  At last follow-up he was doing well without complaints.  He was able to quit smoking.  No history of hypertension or DM.  He was evaluated by Dr. Cristal Deer since his last visit and underwent echocardiography.  Compliant with aspirin and statin   Past Medical History:  Diagnosis Date  . AAA (abdominal aortic aneurysm) (HCC)   . Bell's palsy 04/2016  . GERD (gastroesophageal reflux disease)     Past Surgical History:  Procedure Laterality Date  . ABDOMINAL AORTIC ENDOVASCULAR STENT GRAFT N/A 09/12/2019   Procedure: ABDOMINAL AORTIC ENDOVASCULAR STENT GRAFT;  Surgeon: Nada Libman, MD;  Location: [facility] OR;  Service: Vascular;  Laterality: N/A;  . EMBOLIZATION Left 09/12/2019   Procedure: Coil embolization of left internal iliac artery;  Surgeon: Nada Libman, MD;  Location: [facility] OR;  Service: Vascular;  Laterality: Left;  . ULTRASOUND GUIDANCE FOR VASCULAR ACCESS  09/12/2019   Procedure: Ultrasound Guidance For Vascular Access;  Surgeon: Nada Libman, MD;  Location: [facility] OR;  Service: Vascular;;    Social History   Socioeconomic History  . Marital status: Divorced    Spouse name: Not on file  . Number of children: 4  . Years of education: 6  . Highest education level: Not on file  Occupational History  . Occupation: OTR Naval architect  Tobacco Use  . Smoking status: Current Some Day Smoker    Packs/day: 0.00    Years: 20.00    Pack years: 0.00    Types: Cigarettes  . Smokeless tobacco: Never Used  . Tobacco comment: 1- 2 cigs per week  Vaping Use  . Vaping Use: Never used  Substance and Sexual Activity  . Alcohol use: Yes    Alcohol/week: 3.0 standard drinks    Types: 3 Standard drinks or equivalent per week    Comment:  Occasionanally   . Drug use: Yes    Frequency: 7.0 times per week    Types: Marijuana  . Sexual activity: Not on file  Other Topics Concern  . Not on file  Social History Narrative   Fun: Go-carts, anything adventurous.    Denies religious beliefs effecting health care.    Social Determinants of Health   Financial Resource Strain:   . Difficulty of Paying Living Expenses: Not on file  Food Insecurity:   . Worried About Programme researcher, broadcasting/film/video in the Last Year: Not on file  . Ran Out of Food in the Last Year: Not on file  Transportation Needs:   . Lack of Transportation (Medical): Not on file  . Lack of Transportation (Non-Medical): Not on file  Physical Activity:   . Days of Exercise per Week: Not on file  . Minutes of Exercise per Session: Not on file  Stress:   . Feeling of Stress : Not on file  Social Connections:   . Frequency of Communication with Friends and Family: Not on file  . Frequency of Social Gatherings with Friends and Family: Not on file  . Attends Religious Services: Not on file  . Active Member of Clubs or Organizations: Not on file  . Attends Banker Meetings: Not on file  . Marital Status: Not on file  Intimate Partner Violence:   .  Fear of Current or Ex-Partner: Not on file  . Emotionally Abused: Not on file  . Physically Abused: Not on file  . Sexually Abused: Not on file   Family History  Problem Relation Age of Onset  . Hypertension Mother   . Lung cancer Father   . Lung cancer Maternal Grandmother   . Heart disease Maternal Grandmother   . Hypertension Maternal Grandmother   . Healthy Maternal Grandfather   . Healthy Paternal Grandmother   . Healthy Paternal Grandfather     Current Outpatient Medications  Medication Sig Dispense Refill  . aspirin EC 81 MG tablet Take 81 mg by mouth daily as needed.    . pravastatin (PRAVACHOL) 20 MG tablet Take 1 tablet (20 mg total) by mouth every evening. 30 tablet 11   No current  facility-administered medications for this visit.    No Known Allergies   REVIEW OF SYSTEMS:   [X]  denotes positive finding, [ ]  denotes negative finding Cardiac  Comments:  Chest pain or chest pressure:    Shortness of breath upon exertion:    Short of breath when lying flat:    Irregular heart rhythm:        Vascular    Pain in calf, thigh, or hip brought on by ambulation:    Pain in feet at night that wakes you up from your sleep:     Blood clot in your veins:    Leg swelling:         Pulmonary    Oxygen at home:    Productive cough:     Wheezing:         Neurologic    Sudden weakness in arms or legs:     Sudden numbness in arms or legs:     Sudden onset of difficulty speaking or slurred speech:    Temporary loss of vision in one eye:     Problems with dizziness:         Gastrointestinal    Blood in stool:     Vomited blood:         Genitourinary    Burning when urinating:     Blood in urine:        Psychiatric    Major depression:         Hematologic    Bleeding problems:    Problems with blood clotting too easily:        Skin    Rashes or ulcers:        Constitutional    Fever or chills:      PHYSICAL EXAMINATION:  Vitals:   03/16/20 0922  BP: 134/83  Pulse: 69  Resp: 20  Temp: 98.3 F (36.8 C)  TempSrc: Temporal  SpO2: 99%  Weight: 210 lb (95.3 kg)  Height: 5\' 11"  (1.803 m)    General:  WDWN in NAD; vital signs documented above Gait: Unaided, no ataxia HENT: WNL, normocephalic Pulmonary: normal non-labored breathing , without Rales, rhonchi,  wheezing Cardiac: regular HR, without  Murmurs without carotid bruit Abdomen: soft, NT, no masses Skin: without rashes Vascular Exam/Pulses: 2+ brachial, radial, femoral and DP pulses bilaterally Extremities: without ischemic changes, without Gangrene , without cellulitis; without open wounds;  Musculoskeletal: no muscle wasting or atrophy  Neurologic: A&O X 3;  No focal weakness or  paresthesias are detected Psychiatric:  The pt has Normal affect.   Non-Invasive Vascular Imaging:   03/16/2020 Abdominal Aorta: Patent endovascular aneurysm repair with no evidence of  endoleak. The largest aortic diameter has decreased compared to prior  exam. Previous diameter measurement was 6.45 x 6.52 cm obtained on  10/14/19.  Maximal diameter: 6.4 cm    ASSESSMENT/PLAN:: 49 y.o. male here for follow s/p EVAR.  Doing well.   No evidence of endoleak. Decrease in size of AAA.   Followed up with echocardiogram. Continue aspirin and statin.  Encouraged continued smoking cessation and heart-healthy diet. Return for AAA U/S in one year or sooner should he have pain or concerns.  Milinda Antis, PA-C [facility] [phone]  Clinic MD:   Myra Gianotti

## 2020-03-30 ENCOUNTER — Ambulatory Visit (INDEPENDENT_AMBULATORY_CARE_PROVIDER_SITE_OTHER): Payer: 59 | Admitting: Cardiology

## 2020-03-30 ENCOUNTER — Encounter: Payer: Self-pay | Admitting: Cardiology

## 2020-03-30 ENCOUNTER — Other Ambulatory Visit: Payer: Self-pay

## 2020-03-30 VITALS — BP 112/78 | HR 80 | Ht 71.0 in | Wt 204.0 lb

## 2020-03-30 DIAGNOSIS — Z7189 Other specified counseling: Secondary | ICD-10-CM | POA: Diagnosis not present

## 2020-03-30 DIAGNOSIS — I714 Abdominal aortic aneurysm, without rupture, unspecified: Secondary | ICD-10-CM

## 2020-03-30 DIAGNOSIS — I2510 Atherosclerotic heart disease of native coronary artery without angina pectoris: Secondary | ICD-10-CM

## 2020-03-30 DIAGNOSIS — I77810 Thoracic aortic ectasia: Secondary | ICD-10-CM | POA: Diagnosis not present

## 2020-03-30 DIAGNOSIS — E7800 Pure hypercholesterolemia, unspecified: Secondary | ICD-10-CM

## 2020-03-30 NOTE — Patient Instructions (Signed)
Medication Instructions:  Your Physician recommend you continue on your current medication as directed.    *If you need a refill on your cardiac medications before your next appointment, please call your pharmacy*   Lab Work: None ordered  Testing/Procedures: Your physician has requested that you have an echocardiogram in 1 year. Echocardiography is a painless test that uses sound waves to create images of your heart. It provides your doctor with information about the size and shape of your heart and how well your heart's chambers and valves are working. This procedure takes approximately one hour. There are no restrictions for this procedure. [address]    Follow-Up: At [facility], you and your health needs are our priority.  As part of our continuing mission to provide you with exceptional heart care, we have created designated Provider Care Teams.  These Care Teams include your primary Cardiologist (physician) and Advanced Practice Providers (APPs -  Physician Assistants and Nurse Practitioners) who all work together to provide you with the care you need, when you need it.  We recommend signing up for the patient portal called "MyChart".  Sign up information is provided on this After Visit Summary.  MyChart is used to connect with patients for Virtual Visits (Telemedicine).  Patients are able to view lab/test results, encounter notes, upcoming appointments, etc.  Non-urgent messages can be sent to your provider as well.   To learn more about what you can do with MyChart, go to [email or web address].    Your next appointment:   1 year(s)  The format for your next appointment:   In Person  Provider:   Bridgette Christopher, MD     

## 2020-03-30 NOTE — Progress Notes (Signed)
Cardiology Office Note:    Date:  03/30/2020   ID:  Andre Morris, DOB Oct 28, 1970, MRN [id number]  PCP:  Patient, No Pcp Per  Cardiologist:  Andre Dresser, MD  CC: follow up  History of Present Illness:    Andre Morris is a 49 y.o. male with a hx of AAA s/p EVAR, noted dilated aortic root who is seen for follow up today. I initially met him 10/17/19 at the request of Dr. Trula Morris for dilated aortic root at the level of the sinuses.  Cardiovascular history: had endovascular repair for AAA on 09/12/19 with Dr. Trula Morris. Referred for dilated sinuses seen on CT cardiac. Confirmed with echo 10/2019 with only trivial AI.  Family history: maternal uncle with a history of aneurysm, diagnosed around age 25. Treated, did well but deceased from lung cancer. Maternal grandmother with heart failure. No heart attacks or strokes that they know of. Not much known about paternal side.  Thinks he was on rosuvastatin 10 mg in the past, hasn't taken in about a month. Muscle aches, headaches caused him to stop. Does not think he has tried another.  Today: Doing well overall. Blood pressure well controlled, quit smoking about 30 days go, congratulated. Taking aspirin 81 mg, takes more as needed than daily. Didn't feel well on pravastatin; felt achy and sore, lethargic on it. Tried crestor in the past as well with similar results. Discussed zetia and other options, not interested in alternatives.   Denies chest pain, shortness of breath at rest or with normal exertion. No PND, orthopnea, LE edema or unexpected weight gain. No syncope or palpitations.  Past Medical History:  Diagnosis Date  . AAA (abdominal aortic aneurysm) ([place])   . Bell's palsy 04/2016  . GERD (gastroesophageal reflux disease)     Past Surgical History:  Procedure Laterality Date  . ABDOMINAL AORTIC ENDOVASCULAR STENT GRAFT N/A 09/12/2019   Procedure: ABDOMINAL AORTIC ENDOVASCULAR STENT GRAFT;  Surgeon: Andre Mitchell,  MD;  Location: [facility] OR;  Service: Vascular;  Laterality: N/A;  . EMBOLIZATION Left 09/12/2019   Procedure: Coil embolization of left internal iliac artery;  Surgeon: Andre Mitchell, MD;  Location: [place];  Service: Vascular;  Laterality: Left;  . ULTRASOUND GUIDANCE FOR VASCULAR ACCESS  09/12/2019   Procedure: Ultrasound Guidance For Vascular Access;  Surgeon: Andre Mitchell, MD;  Location: [facility] OR;  Service: Vascular;;    Current Medications: Current Outpatient Medications on File Prior to Visit  Medication Sig  . aspirin EC 81 MG tablet Take 81 mg by mouth daily as needed.  . pravastatin (PRAVACHOL) 20 MG tablet Take 1 tablet (20 mg total) by mouth every evening.   No current facility-administered medications on file prior to visit.     Allergies:   Patient has no known allergies.   Social History   Tobacco Use  . Smoking status: Former Smoker    Packs/day: 0.00    Years: 20.00    Pack years: 0.00    Types: Cigarettes  . Smokeless tobacco: Never Used  . Tobacco comment: 1- 2 cigs per week  Vaping Use  . Vaping Use: Never used  Substance Use Topics  . Alcohol use: Yes    Alcohol/week: 3.0 standard drinks    Types: 3 Standard drinks or equivalent per week    Comment: Occasionanally   . Drug use: Yes    Frequency: 7.0 times per week    Types: Marijuana    Family History: family history includes Healthy  in his maternal grandfather, paternal grandfather, and paternal grandmother; Heart disease in his maternal grandmother; Hypertension in his maternal grandmother and mother; Lung cancer in his father and maternal grandmother.  ROS:   Please see the history of present illness.  Additional pertinent ROS otherwise unremarkable.   EKGs/Labs/Other Studies Reviewed:    The following studies were reviewed today: Echo 5.10.21 1. Left ventricular ejection fraction, by estimation, is 60 to 65%. The  left ventricle has normal function. The left ventricle has no regional  wall  motion abnormalities. There is moderate concentric left ventricular  hypertrophy. Left ventricular  diastolic parameters were normal.  2. Right ventricular systolic function is normal. The right ventricular  size is moderately enlarged.  3. Left atrial size was mildly dilated.  4. Right atrial size was mildly dilated.  5. The mitral valve is normal in structure. Trivial mitral valve  regurgitation. No evidence of mitral stenosis.  6. The aortic valve is tricuspid. Aortic valve regurgitation is trivial.  Mild aortic valve sclerosis is present, with no evidence of aortic valve  stenosis.  7. There is moderate to severe dilatation of the aortic root, at the  level of the sinuses of Valsalva and of the ascending aorta measuring 50  mm and 44mm respectively.  8. The inferior vena cava is normal in size with greater than 50%  respiratory variability, suggesting right atrial pressure of 3 mmHg.   CTA coronary 09/15/19 Aorta: Dilated aortic root at the level of coronary sinuses with maximum diameter 52 mm with mild central non-coaptation. Dilatation of sinotubular junction with maximum diameter 39 mm. Normal size of the ascending aorta. Minimal diffuse atherosclerotic plaque and calcifications and dissection.  Sinotubular Junction: 39 x 35 mm  Ascending Thoracic Aorta: 35 x 35 mm  Aortic Arch: Not visualized.  Descending Thoracic Aorta: 26 x 26 mm  Sinus of Valsalva Measurements:  Non-coronary: 43 mm  Right -coronary: 46 mm  Left -coronary: 45 mm  Aortic Valve:  Trileaflet.  No calcifications.  Coronary Arteries:  Normal coronary origin.  Right dominance.  RCA is a very large lumen dominant artery that gives rise to PDA and PLA. There is minimal diffuse calcified plaque with stenosis 0-25%.  PDA is a large lumen artery with minimal calcified plaque with stenosis 0-25%.  PLA is small with no plaque.  Left main is a large lumen, short artery that gives  rise to LAD and LCX arteries. Distal left main has minimal calcified plaque with stenosis 0-25%.  LAD is a large vessel that gives rise to two diagonal arteries. There is mild calcified plaque in the ostial and proximal LAD with stenosis 0-25%. Mid and distal LAD has only luminal irregularities.  D1,2 are medium size arteries with only luminal irregularities.  LCX is a non-dominant artery that gives rise to one large OM1 branch. There is mild calcified plaque in the proximal LCX artery with stenosis 25-49%.  Other findings:  Normal pulmonary vein drainage into the left atrium.  Normal left atrial appendage without a thrombus.  Normal size of the pulmonary artery.  IMPRESSION: 1. Coronary calcium score of 177. This was 95 percentile for age and sex matched control.  2. Normal coronary origin with right dominance.  3. CAD-RADS 2. Mild non-obstructive CAD (25-49%) in the proximal portion of a non-dominant LCX artery, otherwise only minimal diffuse plaque. Consider non-atherosclerotic causes of chest pain. Consider preventive therapy and risk factor modification.  4. Aorta: Dilated aortic root at the level of coronary sinuses  with maximum diameter 52 mm with mild central non-coaptation. An echocardiogram is recommended for evaluation of a possible aortic regurgitation.  Dilatation of sinotubular junction with maximum diameter 39 mm. Normal size of the ascending aorta. Minimal diffuse atherosclerotic plaque and calcifications and dissection.   EKG:  EKG is personally reviewed.  The ekg ordered 10/17/19 demonstrates sinus rhythm, first degree AV block, borderline LVH  Recent Labs: 09/03/2019: ALT 12 09/12/2019: Magnesium 2.0 09/13/2019: BUN 8; Creatinine, Ser 0.93; Hemoglobin 12.3; Platelets 126; Potassium 3.9; Sodium 138  Recent Lipid Panel    Component Value Date/Time   CHOL 202 (H) 10/17/2019 0854   TRIG 89 10/17/2019 0854   HDL 85 10/17/2019 0854   CHOLHDL  2.4 10/17/2019 0854   CHOLHDL 3 04/27/2015 0856   VLDL 25.8 04/27/2015 0856   LDLCALC 101 (H) 10/17/2019 0854    Physical Exam:    VS:  BP 112/78   Pulse 80   Ht $R'5\' 11"'uA$  (1.803 m)   Wt 204 lb (92.5 kg)   SpO2 95%   BMI 28.45 kg/m     Wt Readings from Last 3 Encounters:  03/30/20 204 lb (92.5 kg)  03/16/20 210 lb (95.3 kg)  10/28/19 178 lb 1.6 oz (80.8 kg)    GEN: Well nourished, well developed in no acute distress HEENT: Normal, moist mucous membranes NECK: No JVD CARDIAC: regular rhythm, normal S1 and S2, no rubs or gallops. No murmur. VASCULAR: Radial and DP pulses 2+ bilaterally. No carotid bruits RESPIRATORY:  Clear to auscultation without rales, wheezing or rhonchi  ABDOMEN: Soft, non-tender, non-distended MUSCULOSKELETAL:  Ambulates independently SKIN: Warm and dry, no edema NEUROLOGIC:  Alert and oriented x 3. No focal neuro deficits noted. PSYCHIATRIC:  Normal affect   ASSESSMENT:    1. AAA (abdominal aortic aneurysm) without rupture ([place])   2. Aortic root dilatation (HCC)   3. Pure hypercholesterolemia   4. Cardiac risk counseling   5. Counseling on health promotion and disease prevention   6. Nonocclusive coronary atherosclerosis of native coronary artery    PLAN:    History of AAA s/p EVAR, found to have aortic root dilation/sinus of Valsalva dilation on CT: -echo, CTA as above -trivial AI -repeat echo in 1 year, then if stable can check less frequently -discussed importance of blood pressure control and CV risk modification -congratulated on tobacco cessation  Hyperlipidemia: With vascular disease and nonobstructive CAD, LDL goal <70 -did not tolerate rosuvastatin in the past -trialed pravastatin, did not tolerate -we discussed ezetimibe, nexletol, and PCSK9i today. He is not interested at this time, continue to address  Cardiac risk counseling and prevention recommendations: -recommend heart healthy/Mediterranean diet, with whole grains, fruits,  vegetable, fish, lean meats, nuts, and olive oil. Limit salt. -recommend moderate walking, 3-5 times/week for 30-50 minutes each session. Aim for at least 150 minutes.week. Goal should be pace of 3 miles/hours, or walking 1.5 miles in 30 minutes -recommend avoidance of tobacco products. Avoid excess alcohol. -Additional risk factor control:  -Diabetes risk: A1c is last 5.5 in 2018, denies history  -Weight: BMI 28 -ASCVD risk score: N/A as has known ASCVD  Plan for follow up: 1 year or sooner as needed  Andre Dresser, MD, PhD [place]  [facility]    Medication Adjustments/Labs and Tests Ordered: Current medicines are reviewed at length with the patient today.  Concerns regarding medicines are outlined above.  Orders Placed This Encounter  Procedures  . ECHOCARDIOGRAM COMPLETE   No orders of the defined types  were placed in this encounter.   Patient Instructions  Medication Instructions:  Your Physician recommend you continue on your current medication as directed.    *If you need a refill on your cardiac medications before your next appointment, please call your pharmacy*   Lab Work: None ordered  Testing/Procedures: Your physician has requested that you have an echocardiogram in 1 year. Echocardiography is a painless test that uses sound waves to create images of your heart. It provides your doctor with information about the size and shape of your heart and how well your heart's chambers and valves are working. This procedure takes approximately one hour. There are no restrictions for this procedure. [address]     Follow-Up: At [facility], you and your health needs are our priority.  As part of our continuing mission to provide you with exceptional heart care, we have created designated Provider Care Teams.  These Care Teams include your primary Cardiologist (physician) and Advanced Practice Providers (APPs -  Physician Assistants and  Nurse Practitioners) who all work together to provide you with the care you need, when you need it.  We recommend signing up for the patient portal called "MyChart".  Sign up information is provided on this After Visit Summary.  MyChart is used to connect with patients for Virtual Visits (Telemedicine).  Patients are able to view lab/test results, encounter notes, upcoming appointments, etc.  Non-urgent messages can be sent to your provider as well.   To learn more about what you can do with MyChart, go to [email or web address].    Your next appointment:   1 year(s)  The format for your next appointment:   In Person  Provider:   Buford Dresser, MD       Signed, Andre Dresser, MD PhD 03/30/2020    [place]

## 2020-04-12 ENCOUNTER — Encounter: Payer: Self-pay | Admitting: Cardiology

## 2020-04-12 DIAGNOSIS — I2510 Atherosclerotic heart disease of native coronary artery without angina pectoris: Secondary | ICD-10-CM | POA: Insufficient documentation

## 2020-07-21 IMAGING — DX DG CHEST 1V PORT
2 series · 2 of 2 positions shown · non-contrast
Comparison: 06/23/2018

CLINICAL DATA: Postop aortic stent placement.

EXAM:
PORTABLE CHEST 1 VIEW

[chest ap (1 of 2)]
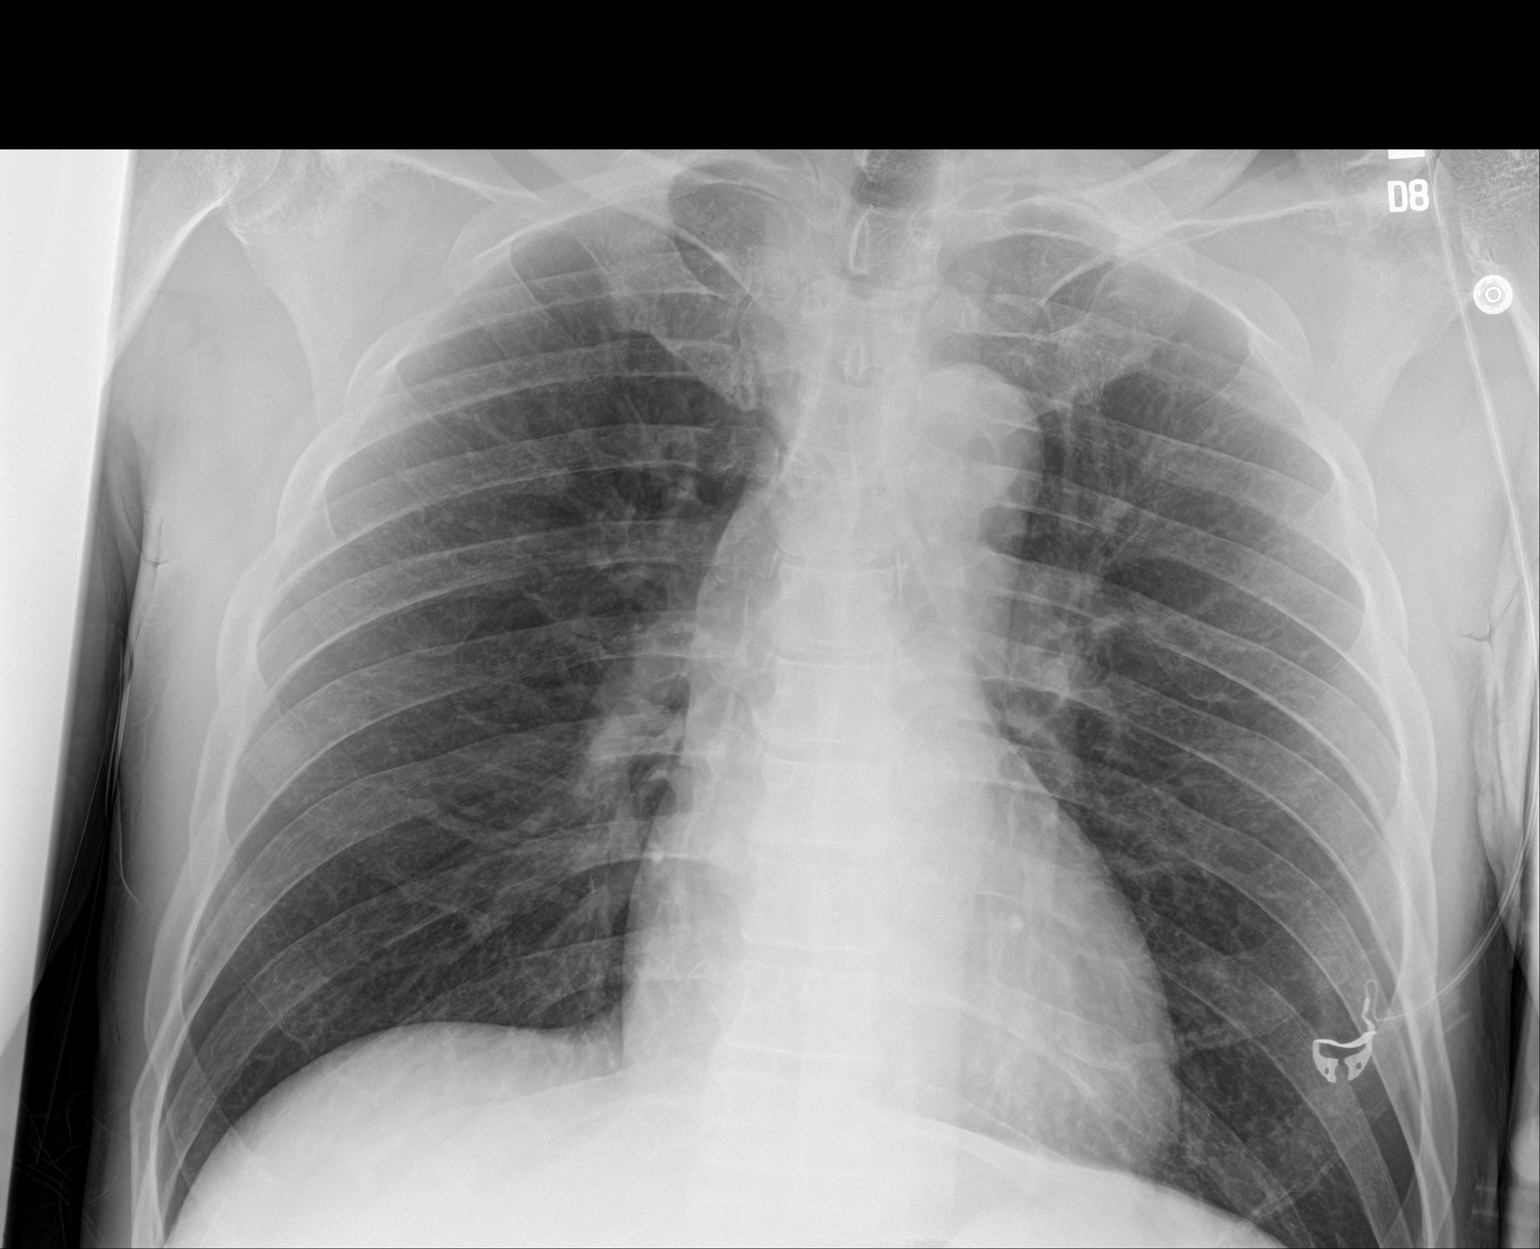

[chest ap (2 of 2)]
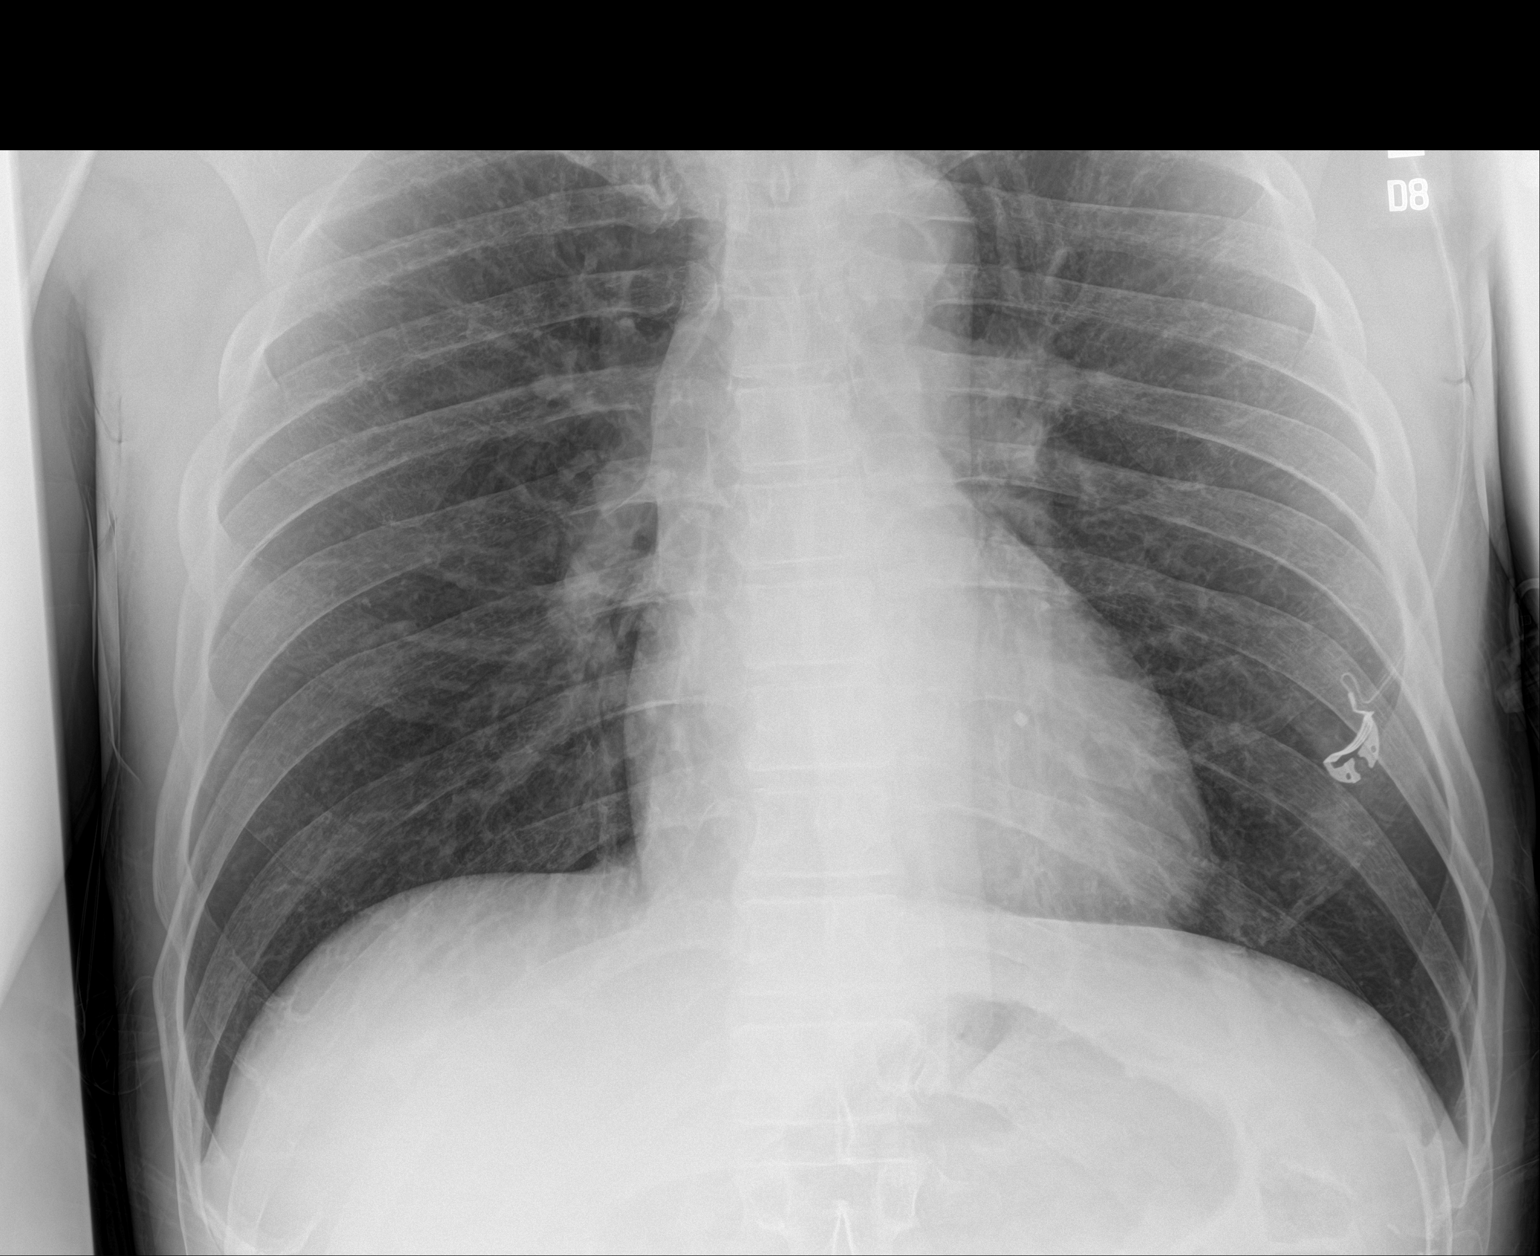

[2 of 2 positions shown; findings below may reference images not displayed]

FINDINGS: Lungs are adequately inflated and otherwise clear. Cardiomediastinal
silhouette and remainder of the chest is unchanged.
IMPRESSION: No active disease.

## 2020-11-28 ENCOUNTER — Other Ambulatory Visit: Payer: Self-pay

## 2020-11-28 DIAGNOSIS — I714 Abdominal aortic aneurysm, without rupture, unspecified: Secondary | ICD-10-CM

## 2021-01-18 ENCOUNTER — Other Ambulatory Visit: Payer: Self-pay

## 2021-01-18 ENCOUNTER — Emergency Department (HOSPITAL_COMMUNITY)
Admission: EM | Admit: 2021-01-18 | Discharge: 2021-01-18 | Disposition: A | Discharge status: Home / Self Care | Payer: Self-pay | Attending: Student | Admitting: Student

## 2021-01-18 DIAGNOSIS — I10 Essential (primary) hypertension: Secondary | ICD-10-CM | POA: Insufficient documentation

## 2021-01-18 DIAGNOSIS — Z5321 Procedure and treatment not carried out due to patient leaving prior to being seen by health care provider: Secondary | ICD-10-CM | POA: Insufficient documentation

## 2021-01-18 DIAGNOSIS — R519 Headache, unspecified: Secondary | ICD-10-CM | POA: Insufficient documentation

## 2021-01-18 DIAGNOSIS — R079 Chest pain, unspecified: Secondary | ICD-10-CM | POA: Insufficient documentation

## 2021-01-18 NOTE — ED Notes (Signed)
Pt called for triage x2 

## 2021-01-19 ENCOUNTER — Ambulatory Visit (INDEPENDENT_AMBULATORY_CARE_PROVIDER_SITE_OTHER): Payer: Self-pay | Admitting: Physician Assistant

## 2021-01-19 ENCOUNTER — Encounter: Payer: Self-pay | Admitting: Physician Assistant

## 2021-01-19 ENCOUNTER — Other Ambulatory Visit: Payer: Self-pay

## 2021-01-19 VITALS — BP 136/85 | HR 76 | Temp 98.2°F | Resp 18 | Ht 71.0 in | Wt 207.0 lb

## 2021-01-19 DIAGNOSIS — Z1159 Encounter for screening for other viral diseases: Secondary | ICD-10-CM

## 2021-01-19 DIAGNOSIS — R0789 Other chest pain: Secondary | ICD-10-CM

## 2021-01-19 DIAGNOSIS — I10 Essential (primary) hypertension: Secondary | ICD-10-CM

## 2021-01-19 DIAGNOSIS — Z9889 Other specified postprocedural states: Secondary | ICD-10-CM

## 2021-01-19 DIAGNOSIS — E7800 Pure hypercholesterolemia, unspecified: Secondary | ICD-10-CM

## 2021-01-19 LAB — EKG 12-LEAD

## 2021-01-19 MED ORDER — LISINOPRIL 10 MG PO TABS: 10.0000 mg | ORAL_TABLET | Freq: Every day | ORAL | 1 refills | Status: DC

## 2021-01-19 NOTE — Progress Notes (Signed)
Patient reports home checks fluctuating in the 150's to 160's. Patient shares prior to procedure BP levels were normal. Patient has eaten today and does not currently take any chronic daily medications.

## 2021-01-19 NOTE — Progress Notes (Signed)
Established Patient Office Visit  Subjective:  Patient ID: Andre Morris, male    DOB: Oct 07, 1970  Age: 50 y.o. MRN: [id number]  CC:  Chief Complaint  Patient presents with   Blood Pressure Check    HPI Andre Morris reports that he has been having elevated blood pressure readings at home, reports readings of 140/90 up to 160/90.   Reports that he has a significant history of endovascular repair of abdominal aortic aneurysm in March 2021.  Reports he has not been treated for hypertension in the past  Reports that he was experiencing some chest pain last month, states that he was seen in the emergency room in [place].  States that he was told that his blood pressure was slightly elevated at that visit.  States that he had a normal EKG.  Reports that he has continued to have some chest pain, describes it as right-sided, will radiate to his shoulder.  Has continued to abstain from cigarettes.  Past Medical History:  Diagnosis Date   AAA (abdominal aortic aneurysm) (HCC)    Bell's palsy 04/2016   GERD (gastroesophageal reflux disease)     Past Surgical History:  Procedure Laterality Date   ABDOMINAL AORTIC ENDOVASCULAR STENT GRAFT N/A 09/12/2019   Procedure: ABDOMINAL AORTIC ENDOVASCULAR STENT GRAFT;  Surgeon: Nada Libman, MD;  Location: [facility] OR;  Service: Vascular;  Laterality: N/A;   EMBOLIZATION Left 09/12/2019   Procedure: Coil embolization of left internal iliac artery;  Surgeon: Nada Libman, MD;  Location: [facility] OR;  Service: Vascular;  Laterality: Left;   ULTRASOUND GUIDANCE FOR VASCULAR ACCESS  09/12/2019   Procedure: Ultrasound Guidance For Vascular Access;  Surgeon: Nada Libman, MD;  Location: [facility] OR;  Service: Vascular;;    Family History  Problem Relation Age of Onset   Hypertension Mother    Lung cancer Father    Lung cancer Maternal Grandmother    Heart disease Maternal Grandmother    Hypertension Maternal Grandmother    Healthy Maternal  Grandfather    Healthy Paternal Grandmother    Healthy Paternal Grandfather     Social History   Socioeconomic History   Marital status: Divorced    Spouse name: Not on file   Number of children: 4   Years of education: 12   Highest education level: Not on file  Occupational History   Occupation: [employer] doctor  Tobacco Use   Smoking status: Former    Packs/day: 0.00    Years: 20.00    Pack years: 0.00    Types: Cigarettes   Smokeless tobacco: Never   Tobacco comments:    1- 2 cigs per week  Vaping Use   Vaping Use: Never used  Substance and Sexual Activity   Alcohol use: Yes    Alcohol/week: 3.0 standard drinks    Types: 3 Standard drinks or equivalent per week    Comment: Occasionanally    Drug use: Yes    Frequency: 7.0 times per week    Types: Marijuana   Sexual activity: Not Currently  Other Topics Concern   Not on file  Social History Narrative   Fun: Go-carts, anything adventurous.    Denies religious beliefs effecting health care.    Social Determinants of Health   Financial Resource Strain: Not on file  Food Insecurity: Not on file  Transportation Needs: Not on file  Physical Activity: Not on file  Stress: Not on file  Social Connections: Not on file  Intimate  Partner Violence: Not on file    ROS Review of Systems  Constitutional: Negative.   HENT: Negative.    Eyes: Negative.   Respiratory:  Negative for shortness of breath.   Cardiovascular:  Positive for chest pain. Negative for palpitations.  Gastrointestinal: Negative.   Endocrine: Negative.   Genitourinary: Negative.   Musculoskeletal: Negative.   Allergic/Immunologic: Negative.   Neurological: Negative.   Hematological: Negative.   Psychiatric/Behavioral: Negative.     Objective:   Today's Vitals: BP 136/85 (BP Location: Right Arm, Patient Position: Sitting, Cuff Size: Normal)   Pulse 76   Temp 98.2 F (36.8 C) (Oral)   Resp 18   Ht 5\' 11"  (1.803 m)   Wt 207 lb (93.9 kg)    SpO2 98%   BMI 28.87 kg/m   Physical Exam Vitals and nursing note reviewed.  Constitutional:      Appearance: Normal appearance.  HENT:     Head: Normocephalic and atraumatic.     Right Ear: External ear normal.     Left Ear: External ear normal.     Nose: Nose normal.     Mouth/Throat:     Mouth: Mucous membranes are moist.     Pharynx: Oropharynx is clear.  Eyes:     Extraocular Movements: Extraocular movements intact.     Conjunctiva/sclera: Conjunctivae normal.     Pupils: Pupils are equal, round, and reactive to light.  Cardiovascular:     Rate and Rhythm: Normal rate and regular rhythm.     Pulses: Normal pulses.     Heart sounds: Normal heart sounds.  Pulmonary:     Effort: Pulmonary effort is normal.     Breath sounds: Normal breath sounds.  Abdominal:     General: Abdomen is flat.     Palpations: Abdomen is soft.  Musculoskeletal:        General: Normal range of motion.     Cervical back: Normal range of motion.  Skin:    General: Skin is warm and dry.  Neurological:     General: No focal deficit present.     Mental Status: He is alert and oriented to person, place, and time.  Psychiatric:        Mood and Affect: Mood normal.        Behavior: Behavior normal.        Thought Content: Thought content normal.        Judgment: Judgment normal.    Assessment & Plan:   Problem List Items Addressed This Visit       Cardiovascular and Mediastinum   Essential hypertension   Relevant Medications   lisinopril (ZESTRIL) 10 MG tablet   Other Relevant Orders   CBC with Differential/Platelet (Completed)   Comp. Metabolic Panel (12) (Completed)   TSH (Completed)     Other   Pure hypercholesterolemia   Relevant Medications   lisinopril (ZESTRIL) 10 MG tablet   Other Relevant Orders   Lipid panel (Completed)   History of AAA (abdominal aortic aneurysm) repair - Primary   Relevant Orders   EKG 12-Lead (Completed)   Other chest pain   Other Visit Diagnoses      Encounter for HCV screening test for low risk patient       Relevant Orders   HCV Ab w Reflex to Quant PCR (Completed)       Outpatient Encounter Medications as of 01/19/2021  Medication Sig   lisinopril (ZESTRIL) 10 MG tablet Take 1 tablet (10 mg total) by  mouth daily.   aspirin EC 81 MG tablet Take 81 mg by mouth daily as needed.   No facility-administered encounter medications on file as of 01/19/2021.  1. History of AAA (abdominal aortic aneurysm) repair EKG within normal limits, patient education given on supportive care, red flags for prompt reevaluation - EKG 12-Lead  2. Essential hypertension Trial lisinopril 10 mg once daily.  Patient encouraged to continue checking blood pressure at home, keep a written log and have available for all office visits. - CBC with Differential/Platelet - Comp. Metabolic Panel (12) - TSH - lisinopril (ZESTRIL) 10 MG tablet; Take 1 tablet (10 mg total) by mouth daily.  Dispense: 30 tablet; Refill: 1  3. Pure hypercholesterolemia  - Lipid panel  4. Other chest pain   5. Encounter for HCV screening test for low risk patient  - HCV Ab w Reflex to Quant PCR  Patient has appointment to establish care at [facility] on February 25, 2021.   I have reviewed the patient's medical history (PMH, PSH, Social History, Family History, Medications, and allergies) , and have been updated if relevant. I spent 33 minutes reviewing chart and  face to face time with patient.   Follow-up: Return in about 5 weeks (around 02/25/2021) for with Ricky Stabs, NP at [facility].   Kasandra Knudsen Mayers, PA-C

## 2021-01-19 NOTE — Patient Instructions (Signed)
You are going to start taking lisinopril 10 mg once a day.  I encourage you to continue checking your blood pressure at home, keep a written log and have available for all office visits.  We will call you with today's lab results.  Roney Jaffe, PA-C Physician Assistant [facility] [email or web address]   Angina  Angina is discomfort in the chest, neck, arm, jaw, or back. The discomfort is caused by a lack of blood in the middle layer of the heart wall (myocardium). There are four types of angina: Stable angina. This is triggered by vigorous activity or exercise. It goes away when you rest or take medicines that treat angina. This is diagnosed if you have had the symptom for more than 2 months. Unstable angina. This is a warning sign and can lead to a heart attack. This is a medical emergency. Symptoms come at rest and last a long time. Microvascular angina. This affects the small coronary arteries. Symptoms include chest pain, feeling tired, and being short of breath. The symptoms can last a long time or short time. Prinzmetal or variant angina. This is caused by a spasm of the arteries that go to your heart. What are the causes? This condition is usually caused by atherosclerosis. This is the buildup of fat and cholesterol (plaque) in your arteries. The plaque may narrow or block the artery. Other causes of angina include: Sudden spasms of the muscles of the arteries in the heart. Small artery disease (microvascular dysfunction). Problems with any of your heart valves. A tear in an artery in your heart (coronary artery dissection). Weakness of the heart muscle (cardiomyopathy). What increases the risk? You are more likely to develop this condition if you have: High cholesterol. High blood pressure. Diabetes. A family history of heart disease. A sedentary lifestyle, or a lifestyle in which you do not exercise  enough. Depression. Had radiation treatment to the left side of your chest. Other risk factors include: Using tobacco. Being obese. Eating a diet high in saturated fats. Being exposed to high stress or triggers of stress. Using drugs, such as cocaine. Women have a greater risk for angina if they: Are older than age 30. Have gone through menopause. What are the signs or symptoms? Common symptoms of this condition in both men and women may include: Chest pain, which may: Feel like a crushing or squeezing in the chest, or a tightness, pressure, fullness, or heaviness in the chest. Last for more than a few minutes, or stop and come back over a few minutes. Pain in the neck, arm, jaw, or back. Unexplained heartburn or indigestion. Shortness of breath. Nausea. Sudden cold sweats. Women and people with diabetes may have unusual (atypical) symptoms, such as: Fatigue. Unexplained feelings of nervousness or anxiety. Unexplained weakness. Dizziness or fainting. How is this diagnosed? This condition may be diagnosed based on: Your symptoms and medical history. Electrocardiogram (ECG) to measure the electrical activity in your heart. Blood tests. Stress test to look for signs of blockage when your heart is stressed. CT angiogram to examine your heart and the blood flow to it. Coronary angiogram to check for arterial blockage. Echocardiogram (ultrasound) to assess the strength of your heartbeat. How is this treated? Angina may be treated with: Medicines to: Prevent blood clots and heart attack. Relax blood vessels and improve blood flow to the heart. Reduce blood pressure, improve heart pumping, and relax blood vessels spasms. Reduce cholesterol and help treat atherosclerosis. A procedure to widen a narrowed  or blocked coronary artery (angioplasty). A mesh tube (stent) may be placed in a coronary artery to keep it open. Surgery to allow blood to go around a blocked artery (coronary  artery bypass surgery). Follow these instructions at home: Medicines Take over-the-counter and prescription medicines only as told by your health care provider. Do not take the following medicines unless your health care provider approves: NSAIDs, such as ibuprofen or naproxen. Vitamin supplements that contain vitamin A, vitamin E, or both. Hormone replacement therapy that contains estrogen with or without progestin. Eating and drinking  Eat a heart-healthy diet. This includes plenty of fresh fruits and vegetables, whole grains, low-fat (lean) protein, and low-fat dairy products. Follow instructions from your health care provider about eating or drinking restrictions.  Activity Follow an exercise program approved by your health care provider. Consider joining a cardiac rehabilitation program. Take a break when you feel fatigued. Plan rest periods in your daily activities. Lifestyle  Do not use any products that contain nicotine or tobacco. These products include cigarettes, chewing tobacco, and vaping devices, such as e-cigarettes. If you need help quitting, ask your health care provider. If your health care provider says you can drink alcohol: Limit how much you have to: 0-1 drink a day for women who are not pregnant. 0-2 drinks a day for men. Be aware of how much alcohol is in your drink. In the [place] one drink equals one 12 oz bottle of beer (355 mL), one 5 oz glass of wine (148 mL), or one 1 oz glass of hard liquor (44 mL).  General instructions Maintain a healthy weight. Learn to manage stress. Keep your vaccinations up to date. Get the flu (influenza) vaccine every year. Talk to your health care provider if you feel depressed. Take a depression screening test to see if you are at risk for depression. Work with your health care provider to manage other health conditions, such as hypertension or diabetes. Keep all follow-up visits. This is important. Get help right away if: You  have pain in your chest, neck, arm, jaw, or back, and the pain: Lasts more than a few minutes. Is recurring. Is not relieved by taking medicines under the tongue (sublingual nitroglycerin). Increases in intensity or frequency. You have a lot of sweating without cause. You have unexplained: Heartburn or indigestion. Shortness of breath or difficulty breathing. Nausea or vomiting. Fatigue. Feelings of nervousness or anxiety. Weakness. You have sudden light-headedness or dizziness. You faint. These symptoms may represent a serious problem that is an emergency. Do not wait to see if the symptoms will go away. Get medical help right away. Call your local emergency services (911 in the [place] Do not drive yourself to the hospital. Summary Angina is discomfort in the chest, neck, arm, jaw, or back that is caused by a lack of blood in the arteries of the heart wall. There are many symptoms of angina. They include chest pain, unexplained heartburn or indigestion, sudden cold sweats, and fatigue. Angina may be treated with lifestyle changes, medicines, or surgery. Symptoms of angina may represent an emergency. Get medical help right away. Call your local emergency services (911 in the [place] Do not drive yourself to the hospital. This information is not intended to replace advice given to you by your health care provider. Make sure you discuss any questions you have with your healthcare provider. Document Revised: 12/06/2019 Document Reviewed: 12/06/2019 Elsevier Patient Education  2022 [employer].

## 2021-01-20 ENCOUNTER — Telehealth: Payer: Self-pay | Admitting: *Deleted

## 2021-01-20 DIAGNOSIS — R0789 Other chest pain: Secondary | ICD-10-CM | POA: Insufficient documentation

## 2021-01-20 DIAGNOSIS — I10 Essential (primary) hypertension: Secondary | ICD-10-CM | POA: Insufficient documentation

## 2021-01-20 DIAGNOSIS — Z9889 Other specified postprocedural states: Secondary | ICD-10-CM | POA: Insufficient documentation

## 2021-01-20 LAB — CBC WITH DIFFERENTIAL/PLATELET
Basophils Absolute: 0 10*3/uL (ref 0.0–0.2)
Basos: 1 %
EOS (ABSOLUTE): 0 10*3/uL (ref 0.0–0.4)
Eos: 1 %
Hematocrit: 43.3 % (ref 37.5–51.0)
Hemoglobin: 14.7 g/dL (ref 13.0–17.7)
Immature Grans (Abs): 0 10*3/uL (ref 0.0–0.1)
Immature Granulocytes: 0 %
Lymphocytes Absolute: 1 10*3/uL (ref 0.7–3.1)
Lymphs: 28 %
MCH: 27.3 pg (ref 26.6–33.0)
MCHC: 33.9 g/dL (ref 31.5–35.7)
MCV: 80 fL (ref 79–97)
Monocytes Absolute: 0.3 10*3/uL (ref 0.1–0.9)
Monocytes: 9 %
Neutrophils Absolute: 2.1 10*3/uL (ref 1.4–7.0)
Neutrophils: 61 %
Platelets: 156 10*3/uL (ref 150–450)
RBC: 5.39 x10E6/uL (ref 4.14–5.80)
RDW: 13.8 % (ref 11.6–15.4)
WBC: 3.4 10*3/uL (ref 3.4–10.8)

## 2021-01-20 LAB — COMP. METABOLIC PANEL (12)
AST: 20 IU/L (ref 0–40)
Albumin/Globulin Ratio: 1.7 (ref 1.2–2.2)
Albumin: 5 g/dL (ref 4.0–5.0)
Alkaline Phosphatase: 101 IU/L (ref 44–121)
BUN/Creatinine Ratio: 12 (ref 9–20)
BUN: 12 mg/dL (ref 6–24)
Bilirubin Total: 0.4 mg/dL (ref 0.0–1.2)
Calcium: 9.8 mg/dL (ref 8.7–10.2)
Chloride: 102 mmol/L (ref 96–106)
Creatinine, Ser: 1.01 mg/dL (ref 0.76–1.27)
Globulin, Total: 2.9 g/dL (ref 1.5–4.5)
Glucose: 79 mg/dL (ref 65–99)
Potassium: 4.6 mmol/L (ref 3.5–5.2)
Sodium: 140 mmol/L (ref 134–144)
Total Protein: 7.9 g/dL (ref 6.0–8.5)
eGFR: 91 mL/min/{1.73_m2} (ref >59)

## 2021-01-20 LAB — LIPID PANEL
Chol/HDL Ratio: 3.1 ratio (ref 0.0–5.0)
Cholesterol, Total: 217 mg/dL — ABNORMAL HIGH (ref 100–199)
HDL: 70 mg/dL (ref >39)
LDL Chol Calc (NIH): 133 mg/dL — ABNORMAL HIGH (ref 0–99)
Triglycerides: 77 mg/dL (ref 0–149)
VLDL Cholesterol Cal: 14 mg/dL (ref 5–40)

## 2021-01-20 LAB — HCV INTERPRETATION

## 2021-01-20 LAB — TSH: TSH: 1.75 u[IU]/mL (ref 0.450–4.500)

## 2021-01-20 LAB — HCV AB W REFLEX TO QUANT PCR: HCV Ab: <0.1 s/co ratio (ref 0.0–0.9)

## 2021-01-20 MED ORDER — ATORVASTATIN CALCIUM 10 MG PO TABS: 10.0000 mg | ORAL_TABLET | Freq: Every day | ORAL | 1 refills | Status: DC

## 2021-01-20 NOTE — Telephone Encounter (Signed)
Patient verified DOB Patient is aware of labs being normal except for needing a cholesterol medication to address elevated levels.

## 2021-01-20 NOTE — Addendum Note (Signed)
Addended by: Roney Jaffe on: 01/20/2021 02:07 PM   Modules accepted: Orders

## 2021-01-20 NOTE — Telephone Encounter (Signed)
-----   Message from Roney Jaffe, [place] sent at 01/20/2021  2:07 PM EDT ----- Please call patient and let him know that his thyroid function, kidney function and liver function are within normal limits.  He does not show signs of anemia.  His cholesterol is elevated.  His risk of a cardiovascular event in the next 10 years is 8.7%.  He should start a cholesterol medication at this time as well as follow a low-cholesterol diet.  Prescription sent to his pharmacy.  The 10-year ASCVD risk score Denman George DC Montez Hageman., et al., 2013) is: 8.7%   Values used to calculate the score:     Age: 50 years     Sex: Male     Is Non-Hispanic African American: Yes     Diabetic: No     Tobacco smoker: No     Systolic Blood Pressure: 136 mmHg     Is BP treated: Yes     HDL Cholesterol: 70 mg/dL     Total Cholesterol: 217 mg/dL

## 2021-01-26 ENCOUNTER — Ambulatory Visit: Payer: Self-pay

## 2021-02-25 ENCOUNTER — Ambulatory Visit: Payer: Self-pay | Admitting: Family

## 2021-02-25 ENCOUNTER — Ambulatory Visit: Payer: Self-pay | Admitting: Family Medicine

## 2021-03-22 ENCOUNTER — Ambulatory Visit: Payer: Self-pay

## 2021-03-22 ENCOUNTER — Inpatient Hospital Stay (HOSPITAL_COMMUNITY): Admission: RE | Admit: 2021-03-22 | Payer: Self-pay | Source: Ambulatory Visit

## 2021-03-30 ENCOUNTER — Other Ambulatory Visit (HOSPITAL_COMMUNITY): Payer: 59

## 2021-04-15 ENCOUNTER — Encounter: Payer: Self-pay | Admitting: Family Medicine

## 2021-04-15 ENCOUNTER — Ambulatory Visit (INDEPENDENT_AMBULATORY_CARE_PROVIDER_SITE_OTHER): Payer: Self-pay | Admitting: Family Medicine

## 2021-04-15 ENCOUNTER — Other Ambulatory Visit: Payer: Self-pay

## 2021-04-15 VITALS — BP 126/70 | HR 81 | Temp 98.2°F | Resp 16 | Ht 71.0 in | Wt 206.2 lb

## 2021-04-15 DIAGNOSIS — F419 Anxiety disorder, unspecified: Secondary | ICD-10-CM

## 2021-04-15 DIAGNOSIS — K219 Gastro-esophageal reflux disease without esophagitis: Secondary | ICD-10-CM

## 2021-04-15 DIAGNOSIS — I10 Essential (primary) hypertension: Secondary | ICD-10-CM

## 2021-04-15 DIAGNOSIS — E7800 Pure hypercholesterolemia, unspecified: Secondary | ICD-10-CM

## 2021-04-15 DIAGNOSIS — Z7689 Persons encountering health services in other specified circumstances: Secondary | ICD-10-CM

## 2021-04-15 MED ORDER — PAROXETINE HCL 10 MG PO TABS: 10.0000 mg | ORAL_TABLET | Freq: Every day | ORAL | 0 refills | Status: DC

## 2021-04-15 NOTE — Progress Notes (Signed)
Patient is here to est care   Patient c/o anxiety from time to time. Patient had AAA last year and is doing better   Patient need refer for colonoscopy.

## 2021-04-16 ENCOUNTER — Encounter: Payer: Self-pay | Admitting: Family Medicine

## 2021-04-16 NOTE — Progress Notes (Signed)
NewEstablished Patient Office Visit  Subjective:  Patient ID: Andre Morris, male    DOB: 06/04/1971  Age: 50 y.o. MRN: [id number]  CC:  Chief Complaint  Patient presents with   Establish Care    HPI Andre Morris presents for follow up of chronic med issues including hypertension and GERD. Patient reports that Andre Morris has been having increased social stressors that are causing some anxiety.   Past Medical History:  Diagnosis Date   AAA (abdominal aortic aneurysm)    Bell's palsy 04/2016   GERD (gastroesophageal reflux disease)     Past Surgical History:  Procedure Laterality Date   ABDOMINAL AORTIC ENDOVASCULAR STENT GRAFT N/A 09/12/2019   Procedure: ABDOMINAL AORTIC ENDOVASCULAR STENT GRAFT;  Surgeon: Nada Libman, MD;  Location: [facility] OR;  Service: Vascular;  Laterality: N/A;   EMBOLIZATION Left 09/12/2019   Procedure: Coil embolization of left internal iliac artery;  Surgeon: Nada Libman, MD;  Location: [facility] OR;  Service: Vascular;  Laterality: Left;   ULTRASOUND GUIDANCE FOR VASCULAR ACCESS  09/12/2019   Procedure: Ultrasound Guidance For Vascular Access;  Surgeon: Nada Libman, MD;  Location: [facility] OR;  Service: Vascular;;    Family History  Problem Relation Age of Onset   Hypertension Mother    Lung cancer Father    Lung cancer Maternal Grandmother    Heart disease Maternal Grandmother    Hypertension Maternal Grandmother    Healthy Maternal Grandfather    Healthy Paternal Grandmother    Healthy Paternal Grandfather     Social History   Socioeconomic History   Marital status: Divorced    Spouse name: Not on file   Number of children: 4   Years of education: 12   Highest education level: Not on file  Occupational History   Occupation: [employer] doctor  Tobacco Use   Smoking status: Former    Packs/day: 0.00    Years: 20.00    Pack years: 0.00    Types: Cigarettes   Smokeless tobacco: Never   Tobacco comments:    1- 2 cigs per week  Vaping  Use   Vaping Use: Never used  Substance and Sexual Activity   Alcohol use: Yes    Alcohol/week: 3.0 standard drinks    Types: 3 Standard drinks or equivalent per week    Comment: Occasionanally    Drug use: Yes    Frequency: 7.0 times per week    Types: Marijuana   Sexual activity: Not Currently  Other Topics Concern   Not on file  Social History Narrative   Fun: Go-carts, anything adventurous.    Denies religious beliefs effecting health care.    Social Determinants of Health   Financial Resource Strain: Not on file  Food Insecurity: Not on file  Transportation Needs: Not on file  Physical Activity: Not on file  Stress: Not on file  Social Connections: Not on file  Intimate Partner Violence: Not on file    ROS Review of Systems  Psychiatric/Behavioral:  Positive for sleep disturbance. Negative for self-injury and suicidal ideas. The patient is nervous/anxious.   All other systems reviewed and are negative.  Objective:   Today's Vitals: BP 126/70   Pulse 81   Temp 98.2 F (36.8 C) (Oral)   Resp 16   Ht 5\' 11"  (1.803 m)   Wt 206 lb 3.2 oz (93.5 kg)   SpO2 95%   BMI 28.76 kg/m   Physical Exam Vitals and nursing note reviewed.  Constitutional:  General: Andre Morris is not in acute distress. Cardiovascular:     Rate and Rhythm: Normal rate and regular rhythm.  Pulmonary:     Effort: Pulmonary effort is normal.     Breath sounds: Normal breath sounds.  Abdominal:     Palpations: Abdomen is soft.     Tenderness: There is no abdominal tenderness.  Musculoskeletal:     Right lower leg: No edema.     Left lower leg: No edema.  Neurological:     General: No focal deficit present.     Mental Status: Andre Morris is alert and oriented to person, place, and time.  Psychiatric:        Mood and Affect: Mood is anxious.        Behavior: Behavior normal.    Assessment & Plan:   1. Anxiety Paxil 10 mg daily prescribed. Will monitor  2. Essential hypertension Patient has dc'd  his meds and appears to be doing well. Continue and monitor  3. Pure hypercholesterolemia Continue present management  4. Gastroesophageal reflux disease without esophagitis Doing well with present management. Continue     Outpatient Encounter Medications as of 04/15/2021  Medication Sig   atorvastatin (LIPITOR) 10 MG tablet Take 1 tablet (10 mg total) by mouth daily.   PARoxetine (PAXIL) 10 MG tablet Take 1 tablet (10 mg total) by mouth daily.   aspirin EC 81 MG tablet Take 81 mg by mouth daily as needed.   lisinopril (ZESTRIL) 10 MG tablet Take 1 tablet (10 mg total) by mouth daily.   No facility-administered encounter medications on file as of 04/15/2021.    Follow-up: Return in about 4 weeks (around 05/13/2021) for follow up, chronic med issues.   Tommie Raymond, MD

## 2021-04-26 ENCOUNTER — Ambulatory Visit (HOSPITAL_COMMUNITY)
Admission: RE | Admit: 2021-04-26 | Discharge: 2021-04-26 | Disposition: A | Discharge status: Home / Self Care | Payer: Self-pay | Source: Ambulatory Visit | Attending: Surgery | Admitting: Surgery

## 2021-04-26 ENCOUNTER — Other Ambulatory Visit: Payer: Self-pay

## 2021-04-26 ENCOUNTER — Ambulatory Visit (INDEPENDENT_AMBULATORY_CARE_PROVIDER_SITE_OTHER): Payer: Self-pay | Admitting: Physician Assistant

## 2021-04-26 ENCOUNTER — Encounter: Payer: Self-pay | Admitting: Physician Assistant

## 2021-04-26 ENCOUNTER — Other Ambulatory Visit: Payer: Self-pay | Admitting: Surgery

## 2021-04-26 ENCOUNTER — Ambulatory Visit (HOSPITAL_BASED_OUTPATIENT_CLINIC_OR_DEPARTMENT_OTHER): Payer: Self-pay

## 2021-04-26 VITALS — BP 129/86 | HR 85 | Temp 97.9°F | Resp 20 | Ht 71.0 in | Wt 215.8 lb

## 2021-04-26 DIAGNOSIS — I714 Abdominal aortic aneurysm, without rupture, unspecified: Secondary | ICD-10-CM

## 2021-04-26 DIAGNOSIS — Z9889 Other specified postprocedural states: Secondary | ICD-10-CM

## 2021-04-26 DIAGNOSIS — I7143 Infrarenal abdominal aortic aneurysm, without rupture: Secondary | ICD-10-CM

## 2021-04-26 DIAGNOSIS — Z8679 Personal history of other diseases of the circulatory system: Secondary | ICD-10-CM

## 2021-04-26 LAB — ECHOCARDIOGRAM COMPLETE
Area-P 1/2: 3.99 cm2
Height: 71 in
P 1/2 time: 483 msec
S' Lateral: 2.9 cm
Weight: 3452.8 oz

## 2021-04-26 NOTE — Progress Notes (Signed)
HISTORY AND PHYSICAL     CC:  follow up. For EVAR Requesting Provider:  Nada Libman, MD  HPI: This is a 50 y.o. male who is here today for follow up for AAA and is s/p EVAR on  09/12/2019 by Dr. Myra Gianotti.  Pt was last seen September 2021 and at that time, he was doing well.  The largest aortic diameter had decreased in size.   The pt returns today for follow up studies.  He denies any abdominal or back pain.  He does not have any claudication or non healing wounds.   He continues smoking cessation! He has not been taking his asa regularly.   He states that he has been having some anxiety attacks recently and was placed on Paxil.     Past Medical History:  Diagnosis Date   AAA (abdominal aortic aneurysm)    Bell's palsy 04/2016   GERD (gastroesophageal reflux disease)     Past Surgical History:  Procedure Laterality Date   ABDOMINAL AORTIC ENDOVASCULAR STENT GRAFT N/A 09/12/2019   Procedure: ABDOMINAL AORTIC ENDOVASCULAR STENT GRAFT;  Surgeon: Nada Libman, MD;  [facility]: [facility];  Service: Vascular;  Laterality: N/A;   EMBOLIZATION Left 09/12/2019   Procedure: Coil embolization of left internal iliac artery;  Surgeon: Nada Libman, MD;  Location: [facility];  Service: Vascular;  Laterality: Left;   ULTRASOUND GUIDANCE FOR VASCULAR ACCESS  09/12/2019   Procedure: Ultrasound Guidance For Vascular Access;  Surgeon: Nada Libman, MD;  Location: [facility];  Service: Vascular;;    No Known Allergies  Current Outpatient Medications  Medication Sig Dispense Refill   aspirin EC 81 MG tablet Take 81 mg by mouth daily as needed.     atorvastatin (LIPITOR) 10 MG tablet Take 1 tablet (10 mg total) by mouth daily. 90 tablet 1   PARoxetine (PAXIL) 10 MG tablet Take 1 tablet (10 mg total) by mouth daily. 30 tablet 0   No current facility-administered medications for this visit.    Family History  Problem Relation Age of Onset   Hypertension Mother    Lung cancer Father    Lung  cancer Maternal Grandmother    Heart disease Maternal Grandmother    Hypertension Maternal Grandmother    Healthy Maternal Grandfather    Healthy Paternal Grandmother    Healthy Paternal Grandfather     Social History   Socioeconomic History   Marital status: Divorced    Spouse name: Not on file   Number of children: 4   Years of education: 12   Highest education level: Not on file  Occupational History   Occupation: [employer] doctor  Tobacco Use   Smoking status: Former    Packs/day: 0.00    Years: 20.00    Pack years: 0.00    Types: Cigarettes   Smokeless tobacco: Never   Tobacco comments:    1- 2 cigs per week  Vaping Use   Vaping Use: Never used  Substance and Sexual Activity   Alcohol use: Yes    Alcohol/week: 3.0 standard drinks    Types: 3 Standard drinks or equivalent per week    Comment: Occasionanally    Drug use: Yes    Frequency: 7.0 times per week    Types: Marijuana   Sexual activity: Not Currently  Other Topics Concern   Not on file  Social History Narrative   Fun: Go-carts, anything adventurous.    Denies religious beliefs effecting health care.  Social Determinants of Health   Financial Resource Strain: Not on file  Food Insecurity: Not on file  Transportation Needs: Not on file  Physical Activity: Not on file  Stress: Not on file  Social Connections: Not on file  Intimate Partner Violence: Not on file     REVIEW OF SYSTEMS:   [X]  denotes positive finding, [ ]  denotes negative finding Cardiac  Comments:  Chest pain or chest pressure:    Shortness of breath upon exertion:    Short of breath when lying flat:    Irregular heart rhythm:        Vascular    Pain in calf, thigh, or hip brought on by ambulation:    Pain in feet at night that wakes you up from your sleep:     Blood clot in your veins:    Leg swelling:         Pulmonary    Oxygen at home:    Productive cough:     Wheezing:         Neurologic    Sudden weakness in  arms or legs:     Sudden numbness in arms or legs:     Sudden onset of difficulty speaking or slurred speech:    Temporary loss of vision in one eye:     Problems with dizziness:         Gastrointestinal    Blood in stool:     Vomited blood:         Genitourinary    Burning when urinating:     Blood in urine:        Psychiatric    Major depression:         Hematologic    Bleeding problems:    Problems with blood clotting too easily:        Skin    Rashes or ulcers:        Constitutional    Fever or chills:      PHYSICAL EXAMINATION:  Today's Vitals   04/26/21 0942  BP: 129/86  Pulse: 85  Resp: 20  Temp: 97.9 F (36.6 C)  TempSrc: Temporal  SpO2: 97%  Weight: 215 lb 12.8 oz (97.9 kg)  Height: 5\' 11"  (1.803 m)   Body mass index is 30.1 kg/m.   General:  WDWN in NAD; vital signs documented above Gait: Not observed HENT: WNL, normocephalic Pulmonary: normal non-labored breathing , without wheezing Cardiac: regular HR, without  Murmur; without carotid bruits Abdomen: soft, NT, no masses; aortic pulse is not palpable Skin: without rashes Vascular Exam/Pulses:  Right Left  Radial 2+ (normal) 2+ (normal)  Popliteal Unable to palpate Unable to palpate  DP 2+ (normal) 2+ (normal)  PT 2+ (normal) 2+ (normal)   Extremities: without ischemic changes, without Gangrene , without cellulitis; without open wounds;  Musculoskeletal: no muscle wasting or atrophy  Neurologic: A&O X 3;  No focal weakness or paresthesias are detected Psychiatric:  The pt has Normal affect.   Non-Invasive Vascular Imaging:   EVAR Arterial duplex on 04/26/2021: Endovascular Aortic Repair (EVAR):  +----------+---------------+--------------+-----------------+--------------  ----+            Diameter AP    Diameter TransVelocities       Comments                  (cm)           (cm)          (cm/sec)                          +----------+---------------+--------------+-----------------+--------------  Aorta     6.10           6.50          63                hyperechoic thrombosed area to the left side  +----------+---------------+--------------+-----------------+--------------  Right Limb1.54           1.99          101                              +----------+---------------+--------------+-----------------+--------------  Left Limb 1.42           1.32          101                              +----------+---------------+--------------+-----------------+--------------   Summary:  Abdominal Aorta: Patent endovascular aneurysm repair with no evidence of  endoleak.   Previous EVAR arterial duplex on 03/16/2020: Endovascular Aortic Repair (EVAR):  +----------+----------------+-------------------+-------------------+------            Diameter AP (cm)Diameter Trans (cm)Velocities  (cm/sec)Comments  +----------+----------------+-------------------+-------------------+------  Aorta     6.29            6.40               48                         +----------+----------------+-------------------+-------------------+------  Right Limb2.79            2.27               65                  distal    +----------+----------------+-------------------+-------------------+------  Left Limb 1.29            1.31               83                         +----------+----------------+-------------------+-------------------+------     ASSESSMENT/PLAN:: 50 y.o. male here with hx of EVAR on 09/12/2019 by Dr. Myra Gianotti.  -pt doing well without abdominal or back pain.  His duplex today reveals no endoleak.  Discussed results with Dr. Myra Gianotti and nothing concerning.   -congratulated and praised pt about continued smoking cessation and its importance.   -encouraged pt to take his asa regularly as well as his paxil per his PCP.  He will continue his Lipitor. -pt does have 3 children ranging in age  from 35-30.  Discussed with him that at some point they would need to be evaluated given familial aspect of AAA.   -pt will f/u in one year with EVAR duplex.  He will call sooner if there are any issues.    Doreatha Massed, [facility] [phone]  Clinic MD:   Myra Gianotti

## 2021-04-29 ENCOUNTER — Telehealth (HOSPITAL_BASED_OUTPATIENT_CLINIC_OR_DEPARTMENT_OTHER): Payer: Self-pay | Admitting: Cardiology

## 2021-04-29 DIAGNOSIS — I7121 Aneurysm of the ascending aorta, without rupture: Secondary | ICD-10-CM

## 2021-04-29 DIAGNOSIS — Z01812 Encounter for preprocedural laboratory examination: Secondary | ICD-10-CM

## 2021-04-29 NOTE — Telephone Encounter (Signed)
Called patient and discussed echo findings with him. Discussed aortic enlargement and concern that there may be a linear structure in the aorta, cannot determine if artifact or possibly dissection. He is asymptomatic. He is amenable to CT angio aorta, last renal function normal but will update. He will come to the [facility] lab and have this drawn today.  Jodelle Red, MD, PhD, [facility]  [facility]  [place]  Heart & Vascular at [facility] at [facility] [address], [address] [place], [place] [address] [phone]

## 2021-04-30 ENCOUNTER — Telehealth (HOSPITAL_BASED_OUTPATIENT_CLINIC_OR_DEPARTMENT_OTHER): Payer: Self-pay | Admitting: Cardiology

## 2021-04-30 LAB — BASIC METABOLIC PANEL
BUN/Creatinine Ratio: 12 (ref 9–20)
BUN: 12 mg/dL (ref 6–24)
CO2: 22 mmol/L (ref 20–29)
Calcium: 9.5 mg/dL (ref 8.7–10.2)
Chloride: 104 mmol/L (ref 96–106)
Creatinine, Ser: 0.99 mg/dL (ref 0.76–1.27)
Glucose: 84 mg/dL (ref 70–99)
Potassium: 4.4 mmol/L (ref 3.5–5.2)
Sodium: 140 mmol/L (ref 134–144)
eGFR: 93 mL/min/{1.73_m2} (ref >59)

## 2021-04-30 NOTE — Telephone Encounter (Signed)
Spoke with patient regarding CTA chest/aorta appt scheduled Tuesday 05/04/21 @ 7:30 am at [facility]--- arrival time is 7:15 am---1st floor radiology----NPO after midnight---patient voiced his understanding.

## 2021-05-04 ENCOUNTER — Other Ambulatory Visit: Payer: Self-pay

## 2021-05-04 ENCOUNTER — Ambulatory Visit (HOSPITAL_COMMUNITY)
Admission: RE | Admit: 2021-05-04 | Discharge: 2021-05-04 | Disposition: A | Discharge status: Home / Self Care | Payer: Self-pay | Source: Ambulatory Visit | Attending: Cardiology | Admitting: Cardiology

## 2021-05-04 ENCOUNTER — Encounter (HOSPITAL_COMMUNITY): Payer: Self-pay

## 2021-05-04 DIAGNOSIS — I7121 Aneurysm of the ascending aorta, without rupture: Secondary | ICD-10-CM | POA: Insufficient documentation

## 2021-05-04 MED ORDER — IOHEXOL 350 MG/ML SOLN
80.0000 mL | Freq: Once | INTRAVENOUS | Status: AC | PRN
  Administered 2021-05-04: 80 mL via INTRAVENOUS

## 2021-05-18 ENCOUNTER — Ambulatory Visit: Payer: Self-pay | Admitting: Family Medicine

## 2021-07-27 ENCOUNTER — Other Ambulatory Visit: Payer: Self-pay | Admitting: Physician Assistant

## 2021-07-27 ENCOUNTER — Other Ambulatory Visit: Payer: Self-pay | Admitting: Family Medicine

## 2021-07-27 DIAGNOSIS — E7800 Pure hypercholesterolemia, unspecified: Secondary | ICD-10-CM

## 2021-08-03 ENCOUNTER — Telehealth: Payer: Self-pay | Admitting: Family Medicine

## 2021-08-03 NOTE — Telephone Encounter (Signed)
atorvastatin (LIPITOR) 10 MG tablet [[id number]]   Pharmacy  [id number] [place], [place] - [phone] AT [facility] [address]  [address] [place], [place] [address]  Phone:  [phone]  Fax:  [phone]

## 2021-08-04 ENCOUNTER — Other Ambulatory Visit: Payer: Self-pay | Admitting: Family Medicine

## 2021-08-04 ENCOUNTER — Other Ambulatory Visit: Payer: Self-pay | Admitting: *Deleted

## 2021-08-04 DIAGNOSIS — E7800 Pure hypercholesterolemia, unspecified: Secondary | ICD-10-CM

## 2021-08-04 MED ORDER — ATORVASTATIN CALCIUM 10 MG PO TABS: ORAL_TABLET | ORAL | 0 refills | Status: DC

## 2021-08-31 ENCOUNTER — Ambulatory Visit (INDEPENDENT_AMBULATORY_CARE_PROVIDER_SITE_OTHER): Payer: BC Managed Care – PPO | Admitting: Family Medicine

## 2021-08-31 ENCOUNTER — Other Ambulatory Visit: Payer: Self-pay

## 2021-08-31 VITALS — BP 137/79 | HR 75 | Temp 98.6°F | Resp 16 | Wt 206.6 lb

## 2021-08-31 DIAGNOSIS — Z1329 Encounter for screening for other suspected endocrine disorder: Secondary | ICD-10-CM

## 2021-08-31 DIAGNOSIS — Z0000 Encounter for general adult medical examination without abnormal findings: Secondary | ICD-10-CM | POA: Diagnosis not present

## 2021-08-31 DIAGNOSIS — Z1159 Encounter for screening for other viral diseases: Secondary | ICD-10-CM

## 2021-08-31 DIAGNOSIS — Z114 Encounter for screening for human immunodeficiency virus [HIV]: Secondary | ICD-10-CM

## 2021-08-31 DIAGNOSIS — Z13220 Encounter for screening for lipoid disorders: Secondary | ICD-10-CM

## 2021-08-31 DIAGNOSIS — Z13228 Encounter for screening for other metabolic disorders: Secondary | ICD-10-CM

## 2021-08-31 DIAGNOSIS — Z125 Encounter for screening for malignant neoplasm of prostate: Secondary | ICD-10-CM

## 2021-08-31 DIAGNOSIS — Z130 Encounter for screening for diseases of the blood and blood-forming organs and certain disorders involving the immune mechanism: Secondary | ICD-10-CM | POA: Diagnosis not present

## 2021-08-31 DIAGNOSIS — Z1211 Encounter for screening for malignant neoplasm of colon: Secondary | ICD-10-CM

## 2021-08-31 NOTE — Progress Notes (Signed)
Patient is here for CPE. Patient would like a referral to urologist for prevent care  ? ? ?Patient has no other concerns today. ?  ?

## 2021-08-31 NOTE — Progress Notes (Signed)
? ?Established Patient Office Visit ? ?Subjective:  ?Patient ID: Andre Morris, male    DOB: 1971/03/15  Age: 51 y.o. MRN: [id number] ? ?CC:  ?Chief Complaint  ?Patient presents with  ? Annual Exam  ? ? ?HPI ?Andre Morris presents for routine annual exam. Patient reports some problems with urination but otherwise denies acute complaints or concerns.  ? ?Past Medical History:  ?Diagnosis Date  ? AAA (abdominal aortic aneurysm)   ? Bell's palsy 04/2016  ? GERD (gastroesophageal reflux disease)   ? ? ?Past Surgical History:  ?Procedure Laterality Date  ? ABDOMINAL AORTIC ENDOVASCULAR STENT GRAFT N/A 09/12/2019  ? Procedure: ABDOMINAL AORTIC ENDOVASCULAR STENT GRAFT;  Surgeon: Serafina Mitchell, MD;  Location: [facility];  Service: Vascular;  Laterality: N/A;  ? EMBOLIZATION Left 09/12/2019  ? Procedure: Coil embolization of left internal iliac artery;  Surgeon: Serafina Mitchell, MD;  Location: [facility];  Service: Vascular;  Laterality: Left;  ? ULTRASOUND GUIDANCE FOR VASCULAR ACCESS  09/12/2019  ? Procedure: Ultrasound Guidance For Vascular Access;  Surgeon: Serafina Mitchell, MD;  Location: [facility];  Service: Vascular;;  ? ? ?Family History  ?Problem Relation Age of Onset  ? Hypertension Mother   ? Lung cancer Father   ? Lung cancer Maternal Grandmother   ? Heart disease Maternal Grandmother   ? Hypertension Maternal Grandmother   ? Healthy Maternal Grandfather   ? Healthy Paternal Grandmother   ? Healthy Paternal Grandfather   ? ? ?Social History  ? ?Socioeconomic History  ? Marital status: Divorced  ?  Spouse name: Not on file  ? Number of children: 4  ? Years of education: 72  ? Highest education level: Not on file  ?Occupational History  ? Occupation: [employer] Administrator  ?Tobacco Use  ? Smoking status: Former  ?  Packs/day: 0.00  ?  Years: 20.00  ?  Pack years: 0.00  ?  Types: Cigarettes  ? Smokeless tobacco: Never  ? Tobacco comments:  ?  1- 2 cigs per week  ?Vaping Use  ? Vaping Use: Never used  ?Substance and Sexual  Activity  ? Alcohol use: Yes  ?  Alcohol/week: 3.0 standard drinks  ?  Types: 3 Standard drinks or equivalent per week  ?  Comment: Occasionanally   ? Drug use: Yes  ?  Frequency: 7.0 times per week  ?  Types: Marijuana  ? Sexual activity: Not Currently  ?Other Topics Concern  ? Not on file  ?Social History Narrative  ? Fun: Go-carts, anything adventurous.   ? Denies religious beliefs effecting health care.   ? ?Social Determinants of Health  ? ?Financial Resource Strain: Not on file  ?Food Insecurity: Not on file  ?Transportation Needs: Not on file  ?Physical Activity: Not on file  ?Stress: Not on file  ?Social Connections: Not on file  ?Intimate Partner Violence: Not on file  ? ? ?ROS ?Review of Systems  ?Genitourinary:  Positive for difficulty urinating. Negative for flank pain and frequency.  ?All other systems reviewed and are negative. ? ?Objective:  ? ?Today's Vitals: BP 137/79   Pulse 75   Temp 98.6 ?F (37 ?C) (Oral)   Resp 16   Wt 206 lb 9.6 oz (93.7 kg)   SpO2 95%   BMI 28.81 kg/m?  ? ?Physical Exam ?Vitals and nursing note reviewed.  ?Constitutional:   ?   General: He is not in acute distress. ?HENT:  ?   Head: Normocephalic and atraumatic.  ?  Right Ear: Tympanic membrane, ear canal and external ear normal.  ?   Left Ear: Tympanic membrane, ear canal and external ear normal.  ?   Nose: Nose normal.  ?   Mouth/Throat:  ?   Mouth: Mucous membranes are moist.  ?   Pharynx: Oropharynx is clear.  ?Eyes:  ?   Conjunctiva/sclera: Conjunctivae normal.  ?   Pupils: Pupils are equal, round, and reactive to light.  ?Neck:  ?   Thyroid: No thyromegaly.  ?Cardiovascular:  ?   Rate and Rhythm: Normal rate and regular rhythm.  ?   Heart sounds: Normal heart sounds. No murmur heard. ?Pulmonary:  ?   Effort: Pulmonary effort is normal.  ?   Breath sounds: Normal breath sounds.  ?Abdominal:  ?   General: There is no distension.  ?   Palpations: Abdomen is soft. There is no mass.  ?   Tenderness: There is no  abdominal tenderness.  ?   Hernia: There is no hernia in the left inguinal area or right inguinal area.  ?Genitourinary: ?   Comments: Deferred - patient referred to urology ?Musculoskeletal:     ?   General: Normal range of motion.  ?   Cervical back: Normal range of motion and neck supple.  ?   Right lower leg: No edema.  ?   Left lower leg: No edema.  ?Skin: ?   General: Skin is warm and dry.  ?Neurological:  ?   General: No focal deficit present.  ?   Mental Status: He is alert and oriented to person, place, and time. Mental status is at baseline.  ?Psychiatric:     ?   Mood and Affect: Mood normal.     ?   Behavior: Behavior normal.  ? ? ?Assessment & Plan:  ? ?1. Annual physical exam ?Routine labs orfered ?- CMP14+EGFR ? ?2. Screening for prostate cancer ?Referral to urology for further eval/mgt ?- Ambulatory referral to Urology ?- PSA ? ?3. Screening for endocrine/metabolic/immunity disorders ? ?- Hemoglobin A1c ?- TSH ? ?4. Screening for lipid disorders ? ?- Lipid Panel ? ?5. Screening for colon cancer ? ?- Ambulatory referral to Gastroenterology ? ?6. Screening for deficiency anemia ? ?- CBC with Differential ? ? ? ? ?Outpatient Encounter Medications as of 08/31/2021  ?Medication Sig  ? atorvastatin (LIPITOR) 10 MG tablet TAKE 1 TABLET(10 MG) BY MOUTH DAILY  ? [DISCONTINUED] aspirin EC 81 MG tablet Take 81 mg by mouth daily as needed.  ? [DISCONTINUED] PARoxetine (PAXIL) 10 MG tablet Take 1 tablet (10 mg total) by mouth daily.  ? ?No facility-administered encounter medications on file as of 08/31/2021.  ? ? ?Follow-up: No follow-ups on file.  ? ?Becky Sax, MD ? ?

## 2021-09-01 ENCOUNTER — Encounter: Payer: Self-pay | Admitting: Family Medicine

## 2021-09-01 LAB — HEMOGLOBIN A1C
Est. average glucose Bld gHb Est-mCnc: 120 mg/dL
Hgb A1c MFr Bld: 5.8 % — ABNORMAL HIGH (ref 4.8–5.6)

## 2021-09-01 LAB — CMP14+EGFR
ALT: 12 IU/L (ref 0–44)
AST: 14 IU/L (ref 0–40)
Albumin/Globulin Ratio: 2 (ref 1.2–2.2)
Albumin: 4.9 g/dL (ref 4.0–5.0)
Alkaline Phosphatase: 99 IU/L (ref 44–121)
BUN/Creatinine Ratio: 12 (ref 9–20)
BUN: 12 mg/dL (ref 6–24)
Bilirubin Total: 0.4 mg/dL (ref 0.0–1.2)
CO2: 20 mmol/L (ref 20–29)
Calcium: 9.4 mg/dL (ref 8.7–10.2)
Chloride: 104 mmol/L (ref 96–106)
Creatinine, Ser: 1 mg/dL (ref 0.76–1.27)
Globulin, Total: 2.5 g/dL (ref 1.5–4.5)
Glucose: 86 mg/dL (ref 70–99)
Potassium: 4.1 mmol/L (ref 3.5–5.2)
Sodium: 144 mmol/L (ref 134–144)
Total Protein: 7.4 g/dL (ref 6.0–8.5)
eGFR: 92 mL/min/{1.73_m2} (ref >59)

## 2021-09-01 LAB — CBC WITH DIFFERENTIAL/PLATELET
Basophils Absolute: 0 10*3/uL (ref 0.0–0.2)
Basos: 0 %
EOS (ABSOLUTE): 0.1 10*3/uL (ref 0.0–0.4)
Eos: 3 %
Hematocrit: 43.5 % (ref 37.5–51.0)
Hemoglobin: 14.5 g/dL (ref 13.0–17.7)
Immature Grans (Abs): 0 10*3/uL (ref 0.0–0.1)
Immature Granulocytes: 0 %
Lymphocytes Absolute: 0.9 10*3/uL (ref 0.7–3.1)
Lymphs: 32 %
MCH: 27.1 pg (ref 26.6–33.0)
MCHC: 33.3 g/dL (ref 31.5–35.7)
MCV: 81 fL (ref 79–97)
Monocytes Absolute: 0.2 10*3/uL (ref 0.1–0.9)
Monocytes: 8 %
Neutrophils Absolute: 1.6 10*3/uL (ref 1.4–7.0)
Neutrophils: 57 %
Platelets: 165 10*3/uL (ref 150–450)
RBC: 5.35 x10E6/uL (ref 4.14–5.80)
RDW: 14.7 % (ref 11.6–15.4)
WBC: 2.9 10*3/uL — ABNORMAL LOW (ref 3.4–10.8)

## 2021-09-01 LAB — PSA: Prostate Specific Ag, Serum: 0.4 ng/mL (ref 0.0–4.0)

## 2021-09-01 LAB — LIPID PANEL
Chol/HDL Ratio: 2.4 ratio (ref 0.0–5.0)
Cholesterol, Total: 171 mg/dL (ref 100–199)
HDL: 71 mg/dL (ref >39)
LDL Chol Calc (NIH): 87 mg/dL (ref 0–99)
Triglycerides: 69 mg/dL (ref 0–149)
VLDL Cholesterol Cal: 13 mg/dL (ref 5–40)

## 2021-09-01 LAB — TSH: TSH: 1.14 u[IU]/mL (ref 0.450–4.500)

## 2021-09-08 ENCOUNTER — Telehealth: Payer: Self-pay | Admitting: Family Medicine

## 2021-09-08 NOTE — Telephone Encounter (Signed)
Copied from CRM [id number]. Topic: Referral - Status ?>> Sep 08, 2021  9:35 AM Marylen Ponto wrote: ?Reason for CRM: Pt stated he has not heard from anyone regarding his request for prostate exam and colonoscopy. ?

## 2021-10-21 ENCOUNTER — Encounter: Payer: Self-pay | Admitting: Family Medicine

## 2021-11-04 ENCOUNTER — Encounter: Payer: Self-pay | Admitting: Family Medicine

## 2021-12-14 ENCOUNTER — Other Ambulatory Visit: Payer: Self-pay | Admitting: Family Medicine

## 2021-12-14 DIAGNOSIS — E7800 Pure hypercholesterolemia, unspecified: Secondary | ICD-10-CM

## 2021-12-14 NOTE — Telephone Encounter (Signed)
Requested Prescriptions  Pending Prescriptions Disp Refills  . atorvastatin (LIPITOR) 10 MG tablet [Pharmacy Med Name: ATORVASTATIN 10MG  TABLETS] 90 tablet 3    Sig: TAKE 1 TABLET(10 MG) BY MOUTH DAILY     Cardiovascular:  Antilipid - Statins Failed - 12/14/2021  3:45 AM      Failed - Lipid Panel in normal range within the last 12 months    Cholesterol, Total  Date Value Ref Range Status  08/31/2021 171 100 - 199 mg/dL Final   LDL Chol Calc (NIH)  Date Value Ref Range Status  08/31/2021 87 0 - 99 mg/dL Final   HDL  Date Value Ref Range Status  08/31/2021 71 >39 mg/dL Final   Triglycerides  Date Value Ref Range Status  08/31/2021 69 0 - 149 mg/dL Final         Passed - Patient is not pregnant      Passed - Valid encounter within last 12 months    Recent Outpatient Visits          3 months ago Annual physical exam   [facility] at [facility], MD   8 months ago Anxiety   Primary Care at [facility], MD   10 months ago History of AAA (abdominal aortic aneurysm) repair   Primary Care at [facility], Cari S, PA-C   2 years ago Abdominal aortic aneurysm (AAA) without rupture [facility])   [facility] [place], [place], Marzella Schlein   4 years ago Health maintenance examination   Primary Care at [place], Sharlene Motts, MD

## 2022-03-08 ENCOUNTER — Ambulatory Visit (INDEPENDENT_AMBULATORY_CARE_PROVIDER_SITE_OTHER): Payer: Commercial Managed Care - PPO | Admitting: Family Medicine

## 2022-03-08 ENCOUNTER — Encounter: Payer: Self-pay | Admitting: Family Medicine

## 2022-03-08 VITALS — BP 134/88 | HR 69 | Temp 97.7°F | Resp 16 | Wt 206.0 lb

## 2022-03-08 DIAGNOSIS — M545 Low back pain, unspecified: Secondary | ICD-10-CM | POA: Diagnosis not present

## 2022-03-08 DIAGNOSIS — M792 Neuralgia and neuritis, unspecified: Secondary | ICD-10-CM

## 2022-03-08 MED ORDER — GABAPENTIN 300 MG PO CAPS: 300.0000 mg | ORAL_CAPSULE | Freq: Three times a day (TID) | ORAL | 1 refills | Status: DC

## 2022-03-08 NOTE — Progress Notes (Unsigned)
Established Patient Office Visit  Subjective    Patient ID: Andre Morris, male    DOB: 15-Dec-1970  Age: 51 y.o. MRN: [id number]  CC:  Chief Complaint  Patient presents with   Follow-up    HPI Andre Morris presents with complaint of persistent intermittent lower back pain and neck pain. Patient reports also some numbness and tingling in his arms.    Outpatient Encounter Medications as of 03/08/2022  Medication Sig   atorvastatin (LIPITOR) 10 MG tablet TAKE 1 TABLET(10 MG) BY MOUTH DAILY   No facility-administered encounter medications on file as of 03/08/2022.    Past Medical History:  Diagnosis Date   AAA (abdominal aortic aneurysm) (HCC)    Bell's palsy 04/2016   GERD (gastroesophageal reflux disease)     Past Surgical History:  Procedure Laterality Date   ABDOMINAL AORTIC ENDOVASCULAR STENT GRAFT N/A 09/12/2019   Procedure: ABDOMINAL AORTIC ENDOVASCULAR STENT GRAFT;  Surgeon: Nada Libman, MD;  Location: [facility];  Service: Vascular;  Laterality: N/A;   EMBOLIZATION Left 09/12/2019   Procedure: Coil embolization of left internal iliac artery;  Surgeon: Nada Libman, MD;  Location: [facility];  Service: Vascular;  Laterality: Left;   ULTRASOUND GUIDANCE FOR VASCULAR ACCESS  09/12/2019   Procedure: Ultrasound Guidance For Vascular Access;  Surgeon: Nada Libman, MD;  Location: [facility] OR;  Service: Vascular;;    Family History  Problem Relation Age of Onset   Hypertension Mother    Lung cancer Father    Lung cancer Maternal Grandmother    Heart disease Maternal Grandmother    Hypertension Maternal Grandmother    Healthy Maternal Grandfather    Healthy Paternal Grandmother    Healthy Paternal Grandfather     Social History   Socioeconomic History   Marital status: Divorced    Spouse name: Not on file   Number of children: 4   Years of education: 12   Highest education level: Not on file  Occupational History   Occupation: [employer] doctor  Tobacco  Use   Smoking status: Former    Packs/day: 0.00    Years: 20.00    Total pack years: 0.00    Types: Cigarettes   Smokeless tobacco: Never   Tobacco comments:    1- 2 cigs per week  Vaping Use   Vaping Use: Never used  Substance and Sexual Activity   Alcohol use: Yes    Alcohol/week: 3.0 standard drinks of alcohol    Types: 3 Standard drinks or equivalent per week    Comment: Occasionanally    Drug use: Yes    Frequency: 7.0 times per week    Types: Marijuana   Sexual activity: Not Currently  Other Topics Concern   Not on file  Social History Narrative   Fun: Go-carts, anything adventurous.    Denies religious beliefs effecting health care.    Social Determinants of Health   Financial Resource Strain: Not on file  Food Insecurity: Not on file  Transportation Needs: Not on file  Physical Activity: Not on file  Stress: Not on file  Social Connections: Not on file  Intimate Partner Violence: Not on file    Review of Systems  Musculoskeletal:  Positive for back pain and neck pain.  Neurological:  Positive for tingling. Negative for weakness.  All other systems reviewed and are negative.       Objective    BP 134/88   Pulse 69   Temp 97.7 F (36.5  C) (Oral)   Resp 16   Wt 206 lb (93.4 kg)   SpO2 97%   BMI 28.73 kg/m   Physical Exam Vitals and nursing note reviewed.  Constitutional:      General: He is not in acute distress. Cardiovascular:     Rate and Rhythm: Normal rate and regular rhythm.  Pulmonary:     Effort: Pulmonary effort is normal.     Breath sounds: Normal breath sounds.  Abdominal:     Palpations: Abdomen is soft.     Tenderness: There is no abdominal tenderness.  Musculoskeletal:     Cervical back: Normal range of motion and neck supple. Pain with movement and muscular tenderness present.     Lumbar back: Tenderness present. No deformity. Decreased range of motion.  Neurological:     General: No focal deficit present.     Mental  Status: He is alert and oriented to person, place, and time.         Assessment & Plan:   1. Left-sided low back pain without sciatica, unspecified chronicity Neurontin prescribed. Tylenol/nsaids prn  2. Neuralgia As above. monitor     No follow-ups on file.   Tommie Raymond, MD

## 2022-03-08 NOTE — Progress Notes (Unsigned)
Patient is here with c/o back pain 4/10 that he said is constance. Patient also said that he has tingling with his arms on the daily. Patient said that he was dx with parastasis

## 2022-03-09 ENCOUNTER — Encounter: Payer: Self-pay | Admitting: Family Medicine

## 2022-03-13 IMAGING — CT CTA CHEST W/ AND/OR W/O CM W/ OR W/O DISSECTION AND GATING
1 of 8 series · 3 of 16 positions shown, 4 images · IV contrast (Omni 300)
Comparison: Prior CT scan of the chest 09/03/2019

CLINICAL DATA: Thoracic aortic aneurysm, possible dissection on
echocardiography

EXAM:
CT ANGIOGRAPHY CHEST WITH CONTRAST
TECHNIQUE: Multidetector CT imaging of the chest was performed using the
standard protocol during bolus administration of intravenous
contrast. Multiplanar CT image reconstructions and MIPs were
obtained to evaluate the vascular anatomy.
CONTRAST:  80mL OMNIPAQUE IOHEXOL 350 MG/ML SOLN

[Series 5: do not send · axial · 0.81mm/px · z∈[+1095,+1446]mm · 3 of 352 slices shown, 4 images]
[im 1/352  soft-tissue]
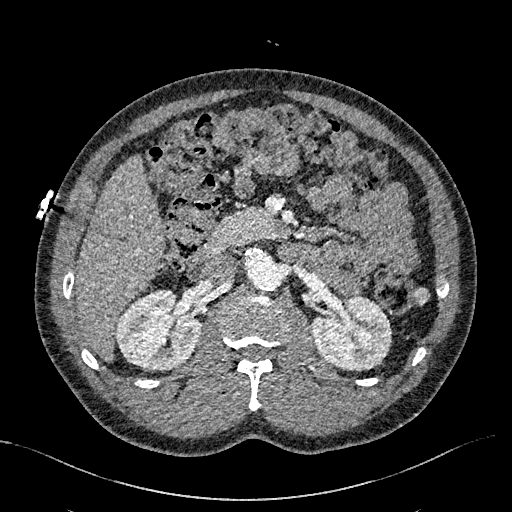
[im 1/352  bone]
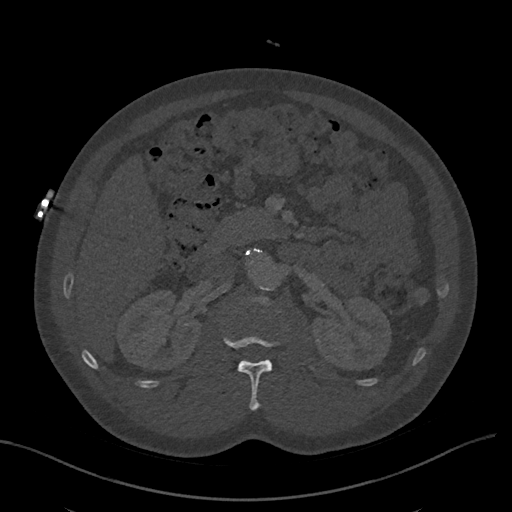
[im 176/352  soft-tissue]
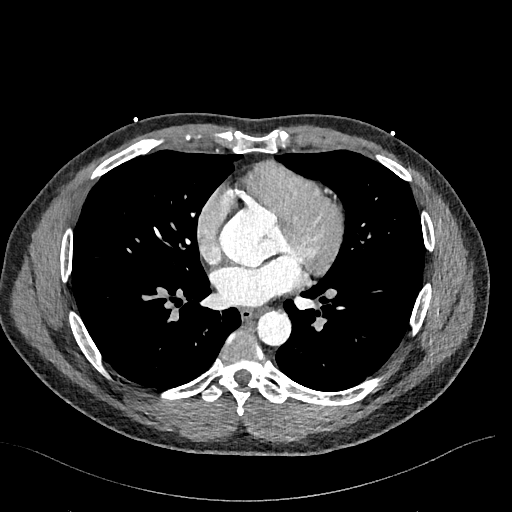
[im 352/352  soft-tissue]
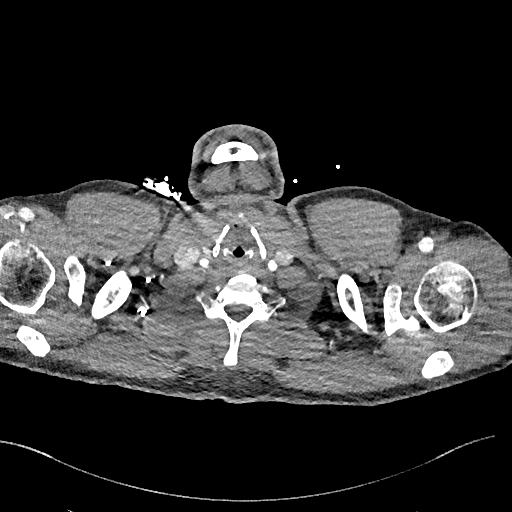

[3 of 16 positions shown; findings below may reference images not displayed]

FINDINGS: Cardiovascular: Conventional 3 vessel arch anatomy. Trace
atherosclerotic vascular calcifications. The aortic root is dilated
with a maximal diameter of 4.9 cm at the sinuses of Valsalva. No
effacement of the Mohammad Ishaq junction. The aorta measures
approximately 3.7 cm. The ascending thoracic aorta measures 3.6 cm.
The transverse and descending thoracic aorta are normal in caliber.
No evidence of dissection. The heart is normal in size.
Calcifications visualized along the coronary arteries. No
pericardial effusion. Normal caliber main pulmonary artery.

Mediastinum/Nodes: Unremarkable CT appearance of the thyroid gland.
No suspicious mediastinal or hilar adenopathy. No soft tissue
mediastinal mass. The thoracic esophagus is unremarkable.

Lungs/Pleura: Lungs are clear. No pleural effusion or pneumothorax.

Upper Abdomen: Stable circumscribed low-attenuation lesions in the
liver consistent with simple cysts.

Musculoskeletal: No chest wall abnormality. No acute or significant
osseous findings.

Review of the MIP images confirms the above findings.
IMPRESSION: 1. No evidence of aortic dissection.
2. Dilation of the aortic root with a maximal diameter of 4.9 cm
measured at the sinuses of Valsalva, slightly increased compared to
4.7 cm previously.
3. No evidence of an aneurysm involving the ascending, transverse or
descending thoracic aorta.
4. Aortic and coronary artery calcifications.
5. No acute cardiopulmonary process.

Aortic Atherosclerosis ([id number]).

## 2022-03-28 ENCOUNTER — Ambulatory Visit (HOSPITAL_BASED_OUTPATIENT_CLINIC_OR_DEPARTMENT_OTHER): Payer: Commercial Managed Care - PPO | Admitting: Cardiology

## 2022-03-28 ENCOUNTER — Encounter (HOSPITAL_BASED_OUTPATIENT_CLINIC_OR_DEPARTMENT_OTHER): Payer: Self-pay | Admitting: Cardiology

## 2022-03-28 VITALS — BP 130/92 | HR 70 | Ht 71.0 in | Wt 215.6 lb

## 2022-03-28 DIAGNOSIS — I7121 Aneurysm of the ascending aorta, without rupture: Secondary | ICD-10-CM

## 2022-03-28 DIAGNOSIS — I2510 Atherosclerotic heart disease of native coronary artery without angina pectoris: Secondary | ICD-10-CM | POA: Diagnosis not present

## 2022-03-28 DIAGNOSIS — I714 Abdominal aortic aneurysm, without rupture, unspecified: Secondary | ICD-10-CM | POA: Diagnosis not present

## 2022-03-28 DIAGNOSIS — I77810 Thoracic aortic ectasia: Secondary | ICD-10-CM

## 2022-03-28 DIAGNOSIS — E7800 Pure hypercholesterolemia, unspecified: Secondary | ICD-10-CM

## 2022-03-28 MED ORDER — ASPIRIN 81 MG PO TBEC: 81.0000 mg | DELAYED_RELEASE_TABLET | Freq: Every day | ORAL | 12 refills | Status: DC

## 2022-03-28 NOTE — Patient Instructions (Signed)
Medication Instructions:  START: Asprin 81 mg daily (Can purchase over the counter)  *If you need a refill on your cardiac medications before your next appointment, please call your pharmacy*   Lab Work: None ordered today    Testing/Procedures: None ordered today   Follow-Up: At [facility], you and your health needs are our priority.  As part of our continuing mission to provide you with exceptional heart care, we have created designated Provider Care Teams.  These Care Teams include your primary Cardiologist (physician) and Advanced Practice Providers (APPs -  Physician Assistants and Nurse Practitioners) who all work together to provide you with the care you need, when you need it.  We recommend signing up for the patient portal called "MyChart".  Sign up information is provided on this After Visit Summary.  MyChart is used to connect with patients for Virtual Visits (Telemedicine).  Patients are able to view lab/test results, encounter notes, upcoming appointments, etc.  Non-urgent messages can be sent to your provider as well.   To learn more about what you can do with MyChart, go to [email or web address].    Your next appointment:   1 year(s)  The format for your next appointment:   In Person  Provider:   Buford Dresser, MD

## 2022-03-28 NOTE — Progress Notes (Signed)
Cardiology Office Note:    Date:  03/28/2022   ID:  Andre Morris, DOB [birth year], MRN [id number]  PCP:  Dorna Mai, MD  Cardiologist:  Buford Dresser, MD  CC: follow up  History of Present Illness:    Andre Morris is a 51 y.o. male with a hx of AAA s/p EVAR, noted dilated aortic root who is seen for follow up today. I initially met him 10/17/19 at the request of Dr. Trula Slade for dilated aortic root at the level of the sinuses.  Cardiovascular history: had endovascular repair for AAA on 09/12/19 with Dr. Trula Slade. Referred for dilated sinuses seen on CT cardiac. Confirmed with echo 10/2019 with only trivial AI.  Family history: maternal uncle with a history of aneurysm, diagnosed around age 44. Treated, did well but deceased from lung cancer. Maternal grandmother with heart failure. No heart attacks or strokes that they know of. Not much known about paternal side.  Today:  The patient is doing well overall. He has chest pains "once in a blue moon." This pain is minor and does not prevent him from activity or work. The pain is not associated with any activity or time of day.   He has been diagnosed with paresthesia in the left arm.   He sometimes has soreness in the back of his neck which prevents him from turning it.   His blood pressure today is 128/86. He does not check his blood pressure at home.  Sometimes in the morning, he feels as if something is stuck in his throat.   He denies any palpitations, shortness of breath, or peripheral edema. No lightheadedness, headaches, syncope, orthopnea, or PND.     Past Medical History:  Diagnosis Date   AAA (abdominal aortic aneurysm) (HCC)    Bell's palsy 04/2016   GERD (gastroesophageal reflux disease)     Past Surgical History:  Procedure Laterality Date   ABDOMINAL AORTIC ENDOVASCULAR STENT GRAFT N/A 09/12/2019   Procedure: ABDOMINAL AORTIC ENDOVASCULAR STENT GRAFT;  Surgeon: Serafina Mitchell, MD;  Location:  [facility] OR;  Service: Vascular;  Laterality: N/A;   EMBOLIZATION Left 09/12/2019   Procedure: Coil embolization of left internal iliac artery;  Surgeon: Serafina Mitchell, MD;  Location: [facility] OR;  Service: Vascular;  Laterality: Left;   ULTRASOUND GUIDANCE FOR VASCULAR ACCESS  09/12/2019   Procedure: Ultrasound Guidance For Vascular Access;  Surgeon: Serafina Mitchell, MD;  Location: [facility];  Service: Vascular;;    Current Medications: Current Outpatient Medications on File Prior to Visit  Medication Sig   atorvastatin (LIPITOR) 10 MG tablet TAKE 1 TABLET(10 MG) BY MOUTH DAILY   gabapentin (NEURONTIN) 300 MG capsule Take 1 capsule (300 mg total) by mouth 3 (three) times daily.   No current facility-administered medications on file prior to visit.     Allergies:   Patient has no known allergies.   Social History   Tobacco Use   Smoking status: Former    Packs/day: 0.00    Years: 20.00    Total pack years: 0.00    Types: Cigarettes   Smokeless tobacco: Never   Tobacco comments:    1- 2 cigs per week  Vaping Use   Vaping Use: Never used  Substance Use Topics   Alcohol use: Yes    Alcohol/week: 3.0 standard drinks of alcohol    Types: 3 Standard drinks or equivalent per week    Comment: Occasionanally    Drug use: Yes    Frequency: 7.0 times  per week    Types: Marijuana    Family History: family history includes Healthy in his maternal grandfather, paternal grandfather, and paternal grandmother; Heart disease in his maternal grandmother; Hypertension in his maternal grandmother and mother; Lung cancer in his father and maternal grandmother.  ROS:   Please see the history of present illness.    (+) Chest pain (+) Paraesthesia (+) Soreness in neck  Additional pertinent ROS otherwise unremarkable.   EKGs/Labs/Other Studies Reviewed:    The following studies were reviewed today:  CT Angio Chest 05/04/21:  1. No evidence of aortic dissection. 2. Dilation of the aortic root  with a maximal diameter of 4.9 cm measured at the sinuses of Valsalva, slightly increased compared to 4.7 cm previously. 3. No evidence of an aneurysm involving the ascending, transverse or descending thoracic aorta. 4. Aortic and coronary artery calcifications. 5. No acute cardiopulmonary process.  Echo 04/26/21:  1. Aneurysm of the aortic root, measuring 50 mm. There is severe  dilatation of the ascending aorta. There is a linear echodensity in the  aortic sinus (non or left coronary cusp seen on parasternal long axis  imaging). Recommend ECG gated CT angio aorta.  Critical results discussed with Dr. Harrell Gave 4:15 pm 04/26/21.   2. Left ventricular ejection fraction by 3D volume is 68 %. The left  ventricle has normal function. The left ventricle has no regional wall  motion abnormalities. There is mild left ventricular hypertrophy. Left  ventricular diastolic parameters were  normal. The average left ventricular global longitudinal strain is -24.2  %. The global longitudinal strain is normal.   3. Right ventricular systolic function is normal. The right ventricular  size is normal. Tricuspid regurgitation signal is inadequate for assessing  PA pressure.   4. The mitral valve is normal in structure. Trivial mitral valve  regurgitation. No evidence of mitral stenosis.   5. The aortic valve is tricuspid. Aortic valve regurgitation is mild. No  aortic stenosis is present.   6. The inferior vena cava is normal in size with greater than 50%  respiratory variability, suggesting right atrial pressure of 3 mmHg.   7. 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 Atrial septal aneurysm.  Evidence of atrial level shunting detected by color flow Doppler.   Endovascular Aortic Repair Study 04/26/21:  Summary:  Abdominal Aorta: Patent endovascular aneurysm repair with no evidence of  endoleak.   Endovascular Aortic Repair Study  03/16/20:  Summary:  Abdominal Aorta: Patent endovascular  aneurysm repair with no evidence of  endoleak. The largest aortic diameter has decreased compared to prior  exam. Previous diameter measurement was 6.45 x 6.52 cm obtained on  10/14/19.    Echo 5.10.21  1. Left ventricular ejection fraction, by estimation, is 60 to 65%. The  left ventricle has normal function. The left ventricle has no regional  wall motion abnormalities. There is moderate concentric left ventricular  hypertrophy. Left ventricular  diastolic parameters were normal.   2. Right ventricular systolic function is normal. The right ventricular  size is moderately enlarged.   3. Left atrial size was mildly dilated.   4. Right atrial size was mildly dilated.   5. The mitral valve is normal in structure. Trivial mitral valve  regurgitation. No evidence of mitral stenosis.   6. The aortic valve is tricuspid. Aortic valve regurgitation is trivial.  Mild aortic valve sclerosis is present, with no evidence of aortic valve  stenosis.   7. There is moderate to severe dilatation of the  aortic root, at the  level of the sinuses of Valsalva and of the ascending aorta measuring 50  mm and 26m respectively.   8. The inferior vena cava is normal in size with greater than 50%  respiratory variability, suggesting right atrial pressure of 3 mmHg.   CTA coronary 09/15/19 Aorta: Dilated aortic root at the level of coronary sinuses with maximum diameter 52 mm with mild central non-coaptation. Dilatation of sinotubular junction with maximum diameter 39 mm. Normal size of the ascending aorta. Minimal diffuse atherosclerotic plaque and calcifications and dissection.   Sinotubular Junction: 39 x 35 mm   Ascending Thoracic Aorta: 35 x 35 mm   Aortic Arch: Not visualized.   Descending Thoracic Aorta: 26 x 26 mm   Sinus of Valsalva Measurements:   Non-coronary: 43 mm   Right -coronary: 46 mm   Left -coronary: 45 mm   Aortic Valve:  Trileaflet.  No calcifications.   Coronary  Arteries:  Normal coronary origin.  Right dominance.   RCA is a very large lumen dominant artery that gives rise to PDA and PLA. There is minimal diffuse calcified plaque with stenosis 0-25%.   PDA is a large lumen artery with minimal calcified plaque with stenosis 0-25%.   PLA is small with no plaque.   Left main is a large lumen, short artery that gives rise to LAD and LCX arteries. Distal left main has minimal calcified plaque with stenosis 0-25%.   LAD is a large vessel that gives rise to two diagonal arteries. There is mild calcified plaque in the ostial and proximal LAD with stenosis 0-25%. Mid and distal LAD has only luminal irregularities.   D1,2 are medium size arteries with only luminal irregularities.   LCX is a non-dominant artery that gives rise to one large OM1 branch. There is mild calcified plaque in the proximal LCX artery with stenosis 25-49%.   Other findings:   Normal pulmonary vein drainage into the left atrium.   Normal left atrial appendage without a thrombus.   Normal size of the pulmonary artery.   IMPRESSION: 1. Coronary calcium score of 177. This was 95 percentile for age and sex matched control.   2. Normal coronary origin with right dominance.   3. CAD-RADS 2. Mild non-obstructive CAD (25-49%) in the proximal portion of a non-dominant LCX artery, otherwise only minimal diffuse plaque. Consider non-atherosclerotic causes of chest pain. Consider preventive therapy and risk factor modification.   4. Aorta: Dilated aortic root at the level of coronary sinuses with maximum diameter 52 mm with mild central non-coaptation. An echocardiogram is recommended for evaluation of a possible aortic regurgitation.   Dilatation of sinotubular junction with maximum diameter 39 mm. Normal size of the ascending aorta. Minimal diffuse atherosclerotic plaque and calcifications and dissection.   EKG:  EKG is personally reviewed.  03/28/22: sinus rhythm with  first degree AV block at 70 bpm 10/17/19: sinus rhythm, first degree AV block, borderline LVH  Recent Labs: 08/31/2021: ALT 12; BUN 12; Creatinine, Ser 1.00; Hemoglobin 14.5; Platelets 165; Potassium 4.1; Sodium 144; TSH 1.140  Recent Lipid Panel    Component Value Date/Time   CHOL 171 08/31/2021 1407   TRIG 69 08/31/2021 1407   HDL 71 08/31/2021 1407   CHOLHDL 2.4 08/31/2021 1407   CHOLHDL 3 04/27/2015 0856   VLDL 25.8 04/27/2015 0856   LDLCALC 87 08/31/2021 1407    Physical Exam:    VS:  BP (!) 130/92 (BP Location: Right Arm, Patient Position: Sitting, Cuff  Size: Large)   Pulse 70   Ht _0  (1.803 m)   Wt 215 lb 9.6 oz (97.8 kg)   BMI 30.07 kg/m     Wt Readings from Last 3 Encounters:  03/08/22 206 lb (93.4 kg)  08/31/21 206 lb 9.6 oz (93.7 kg)  04/26/21 215 lb 12.8 oz (97.9 kg)    GEN: Well nourished, well developed in no acute distress HEENT: Normal, moist mucous membranes NECK: No JVD CARDIAC: regular rhythm, normal S1 and S2, no rubs or gallops. No murmur. VASCULAR: Radial and DP pulses 2+ bilaterally. No carotid bruits RESPIRATORY:  Clear to auscultation without rales, wheezing or rhonchi  ABDOMEN: Soft, non-tender, non-distended MUSCULOSKELETAL:  Ambulates independently SKIN: Warm and dry, no edema NEUROLOGIC:  Alert and oriented x 3. No focal neuro deficits noted. PSYCHIATRIC:  Normal affect   ASSESSMENT:    1. Nonocclusive coronary atherosclerosis of native coronary artery   2. Aneurysm of ascending aorta without rupture (HCC)   3. Abdominal aortic aneurysm (AAA) without rupture, unspecified part ([place])   4. Aortic root dilatation (HCC)   5. Pure hypercholesterolemia     PLAN:    History of AAA s/p EVAR, found to have aortic root dilation/sinus of Valsalva dilation on CT: -echo, CTA as above -trivial AI -EVAR followed by vascular surgery -discussed importance of blood pressure control and CV risk modification -congratulated on tobacco  cessation  Hyperlipidemia: With vascular disease and nonobstructive CAD, LDL goal <70 -did not tolerate rosuvastatin in the past -trialed pravastatin, did not tolerate -now tolerating low dose atorvastatin, continue -discussed role of aspirin, he is amenable to try, ordered today  Cardiac risk counseling and prevention recommendations: -recommend heart healthy/Mediterranean diet, with whole grains, fruits, vegetable, fish, lean meats, nuts, and olive oil. Limit salt. -recommend moderate walking, 3-5 times/week for 30-50 minutes each session. Aim for at least 150 minutes.week. Goal should be pace of 3 miles/hours, or walking 1.5 miles in 30 minutes -recommend avoidance of tobacco products. Avoid excess alcohol.  Plan for follow up: 1 year or sooner as needed, with repeat echo or CTA at that time if not done previously  Buford Dresser, MD, PhD [place]  [facility]    Medication Adjustments/Labs and Tests Ordered: Current medicines are reviewed at length with the patient today.  Concerns regarding medicines are outlined above.  Orders Placed This Encounter  Procedures   EKG 12-Lead   Meds ordered this encounter  Medications   aspirin EC 81 MG tablet    Sig: Take 1 tablet (81 mg total) by mouth daily. Swallow whole.    Dispense:  30 tablet    Refill:  12    Patient Instructions  Medication Instructions:  START: Asprin 81 mg daily (Can purchase over the counter)  *If you need a refill on your cardiac medications before your next appointment, please call your pharmacy*   Lab Work: None ordered today    Testing/Procedures: None ordered today   Follow-Up: At [facility], you and your health needs are our priority.  As part of our continuing mission to provide you with exceptional heart care, we have created designated Provider Care Teams.  These Care Teams include your primary Cardiologist (physician) and Advanced Practice Providers (APPs -  Physician  Assistants and Nurse Practitioners) who all work together to provide you with the care you need, when you need it.  We recommend signing up for the patient portal called "MyChart".  Sign up information is provided on this  After Visit Summary.  MyChart is used to connect with patients for Virtual Visits (Telemedicine).  Patients are able to view lab/test results, encounter notes, upcoming appointments, etc.  Non-urgent messages can be sent to your provider as well.   To learn more about what you can do with MyChart, go to [email or web address].    Your next appointment:   1 year(s)  The format for your next appointment:   In Person  Provider:   Buford Dresser, MD             Martie Lee Mosetta Pigeon Buren,acting as a scribe for Buford Dresser, MD.,have documented all relevant documentation on the behalf of Buford Dresser, MD,as directed by  Buford Dresser, MD while in the presence of Buford Dresser, MD.   I, Buford Dresser, MD, have reviewed all documentation for this visit. The documentation on 05/04/22 for the exam, diagnosis, procedures, and orders are all accurate and complete.

## 2022-04-12 ENCOUNTER — Other Ambulatory Visit: Payer: Self-pay | Admitting: *Deleted

## 2022-04-12 DIAGNOSIS — Z8679 Personal history of other diseases of the circulatory system: Secondary | ICD-10-CM

## 2022-04-20 NOTE — Progress Notes (Signed)
HISTORY AND PHYSICAL     CC:  follow up. For EVAR Requesting Provider:  Dorna Mai, MD  HPI: This is a 51 y.o. male who is here today for follow up for AAA and is s/p EVAR on  09/12/2019 by Dr. Trula Slade.  Pt was last seen 04/26/2021 and at that time, he was doing well without abdominal or back pain or claudication or non healing wounds. I had still quit smoking.  He had recently been placed on Paxil for anxiety attacks.   The pt returns today for follow up studies.  He states he has been doing well.  He does have some complaints about bilateral arm numbness that comes and goes over the past month.  He states he had been diagnosed with paraesthesias in the past.  He denies any neck pain.  He states he has not had any severe sudden back or abdominal pain but does have occasional  back pain that is not severe that does not last.  He continues to not smoke cigarettes.   The pt is  on a statin for cholesterol management.    The pt is on an aspirin.    Other AC:  none The pt is not on medication for hypertension.  The pt does not have diabetes. Tobacco hx:  former   Past Medical History:  Diagnosis Date   AAA (abdominal aortic aneurysm) (HCC)    Bell's palsy 04/2016   GERD (gastroesophageal reflux disease)     Past Surgical History:  Procedure Laterality Date   ABDOMINAL AORTIC ENDOVASCULAR STENT GRAFT N/A 09/12/2019   Procedure: ABDOMINAL AORTIC ENDOVASCULAR STENT GRAFT;  Surgeon: Serafina Mitchell, MD;  Location: [facility] OR;  Service: Vascular;  Laterality: N/A;   EMBOLIZATION Left 09/12/2019   Procedure: Coil embolization of left internal iliac artery;  Surgeon: Serafina Mitchell, MD;  Location: [facility] OR;  Service: Vascular;  Laterality: Left;   ULTRASOUND GUIDANCE FOR VASCULAR ACCESS  09/12/2019   Procedure: Ultrasound Guidance For Vascular Access;  Surgeon: Serafina Mitchell, MD;  Location: [facility] OR;  Service: Vascular;;    No Known Allergies  Current Outpatient Medications  Medication Sig  Dispense Refill   aspirin EC 81 MG tablet Take 1 tablet (81 mg total) by mouth daily. Swallow whole. 30 tablet 12   atorvastatin (LIPITOR) 10 MG tablet TAKE 1 TABLET(10 MG) BY MOUTH DAILY 90 tablet 3   No current facility-administered medications for this visit.    Family History  Problem Relation Age of Onset   Hypertension Mother    Lung cancer Father    Lung cancer Maternal Grandmother    Heart disease Maternal Grandmother    Hypertension Maternal Grandmother    Healthy Maternal Grandfather    Healthy Paternal Grandmother    Healthy Paternal Grandfather     Social History   Socioeconomic History   Marital status: Divorced    Spouse name: Not on file   Number of children: 4   Years of education: 12   Highest education level: Not on file  Occupational History   Occupation: OTR Truck Geophysicist/field seismologist  Tobacco Use   Smoking status: Former    Packs/day: 0.00    Years: 20.00    Total pack years: 0.00    Types: Cigarettes   Smokeless tobacco: Never   Tobacco comments:    1- 2 cigs per week  Vaping Use   Vaping Use: Never used  Substance and Sexual Activity   Alcohol use: Yes    Alcohol/week:  3.0 standard drinks of alcohol    Types: 3 Standard drinks or equivalent per week    Comment: Occasionanally    Drug use: Yes    Frequency: 7.0 times per week    Types: Marijuana   Sexual activity: Not Currently  Other Topics Concern   Not on file  Social History Narrative   Fun: Go-carts, anything adventurous.    Denies religious beliefs effecting health care.    Social Determinants of Health   Financial Resource Strain: Not on file  Food Insecurity: Not on file  Transportation Needs: Not on file  Physical Activity: Not on file  Stress: Not on file  Social Connections: Not on file  Intimate Partner Violence: Not on file     REVIEW OF SYSTEMS:   [X]  denotes positive finding, [ ]  denotes negative finding Cardiac  Comments:  Chest pain or chest pressure:    Shortness of  breath upon exertion:    Short of breath when lying flat:    Irregular heart rhythm:        Vascular    Pain in calf, thigh, or hip brought on by ambulation:    Pain in feet at night that wakes you up from your sleep:     Blood clot in your veins:    Leg swelling:         Pulmonary    Oxygen at home:    Productive cough:     Wheezing:         Neurologic    Sudden weakness in arms or legs:     Sudden numbness in arms or legs:     Sudden onset of difficulty speaking or slurred speech:    Temporary loss of vision in one eye:     Problems with dizziness:         Gastrointestinal    Blood in stool:     Vomited blood:         Genitourinary    Burning when urinating:     Blood in urine:        Psychiatric    Major depression:         Hematologic    Bleeding problems:    Problems with blood clotting too easily:        Skin    Rashes or ulcers:        Constitutional    Fever or chills:      PHYSICAL EXAMINATION:  Today's Vitals   04/25/22 0816  BP: 133/89  Pulse: 64  Resp: 20  Temp: (!) 97.3 F (36.3 C)  TempSrc: Temporal  SpO2: 100%  Weight: 219 lb 12.8 oz (99.7 kg)  Height: 5\' 11"  (1.803 m)   Body mass index is 30.66 kg/m.   General:  WDWN in NAD; vital signs documented above Gait: Not observed HENT: WNL, normocephalic Pulmonary: normal non-labored breathing  Cardiac: regular HR;  without carotid bruits Abdomen: soft, NT; aortic pulse is slightly palpable Skin: without rashes Vascular Exam/Pulses: Bilateral DP and radial pulses are easily palpable Extremities: without open wounds Musculoskeletal: no muscle wasting or atrophy  Neurologic: A&O X 3 Psychiatric:  The pt has Normal affect.   Non-Invasive Vascular Imaging:   EVAR Arterial duplex on 04/25/2022: Abdominal Aorta Findings:  +--------+-------+----------+----------+--------+--------+--------+  LocationAP (cm)Trans (cm)PSV (cm/s)WaveformThrombusComments   +--------+-------+----------+----------+--------+--------+--------+  Proximal2.76   2.87      62                                  +--------+-------+----------+----------+--------+--------+--------+  Mid     3.09   3.00      54                                  +--------+-------+----------+----------+--------+--------+--------+  Distal  6.20   6.89      47                                  +--------+-------+----------+----------+--------+--------+--------+   Endovascular Aortic Repair (EVAR):  +----------+----------------+-------------------+-------------------+            Diameter AP (cm)Diameter Trans (cm)Velocities (cm/sec)  +----------+----------------+-------------------+-------------------+  Aorta     6.20            6.89               47                   +----------+----------------+-------------------+-------------------+  Right Limb1.30            1.52               125                  +----------+----------------+-------------------+-------------------+  Left Limb 1.17            1.52               49                   +----------+----------------+-------------------+-------------------+   Summary:  Abdominal Aorta: Patent endovascular aneurysm repair with no evidence of  endoleak.      Previous EVAR arterial duplex on 04/26/2021: Endovascular Aortic Repair (EVAR):  +----------+---------------+--------------+-----------------+--------------            Diameter AP    Diameter TransVelocities       Comments                  (cm)           (cm)          (cm/sec)                         +----------+---------------+--------------+-----------------+--------------  Aorta     6.10           6.50          63                hyperechoic thrombosed area to the left side  +----------+---------------+--------------+-----------------+--------------  Right Limb1.54           1.99          101                               +----------+---------------+--------------+-----------------+--------------  Left Limb 1.42           1.32          101                              +----------+---------------+--------------+-----------------+--------------   Summary:  Abdominal Aorta: Patent endovascular aneurysm repair with no evidence of  endoleak.    ASSESSMENT/PLAN:: 51 y.o. male here with hx of EVAR on 09/12/2019 by Dr. Myra Gianotti  -duplex today reveals slight increase in AAA but  no endoleak.  He has not had any severe sudden abdominal or back pain.  Discussed duplex results with Dr. Myra Gianotti and given the findings today, will have him return in 6 months with EVAR duplex.  If there is any concern about enlargement, we will schedule him for CTA c/a/p to definitely evaluate.  This will also evaluate ascending aorta as CTA in the past revealed Dilated aortic root at the level of coronary sinuses with maximum diameter 52 mm in 2021.  CTA chest in 2022 revealed maximal diameter of 4.9 cm at the sinuses of Valsalva.  This is being followed by cardiology.   -continue asa/statin -pt will f/u in 6 months with EVAR duplex. -applauded him for his continued smoking cessation   Doreatha Massed, [facility] [phone]  Clinic MD:   Myra Gianotti

## 2022-04-25 ENCOUNTER — Ambulatory Visit (HOSPITAL_COMMUNITY)
Admission: RE | Admit: 2022-04-25 | Discharge: 2022-04-25 | Disposition: A | Discharge status: Home / Self Care | Payer: Commercial Managed Care - PPO | Source: Ambulatory Visit | Attending: Surgery | Admitting: Surgery

## 2022-04-25 ENCOUNTER — Ambulatory Visit: Payer: Commercial Managed Care - PPO | Admitting: Physician Assistant

## 2022-04-25 VITALS — BP 133/89 | HR 64 | Temp 97.3°F | Resp 20 | Ht 71.0 in | Wt 219.8 lb

## 2022-04-25 DIAGNOSIS — Z8679 Personal history of other diseases of the circulatory system: Secondary | ICD-10-CM | POA: Insufficient documentation

## 2022-04-25 DIAGNOSIS — I7143 Infrarenal abdominal aortic aneurysm, without rupture: Secondary | ICD-10-CM | POA: Diagnosis not present

## 2022-04-25 DIAGNOSIS — Z9889 Other specified postprocedural states: Secondary | ICD-10-CM | POA: Insufficient documentation

## 2022-04-26 ENCOUNTER — Other Ambulatory Visit (HOSPITAL_COMMUNITY): Payer: Commercial Managed Care - PPO

## 2022-04-26 ENCOUNTER — Ambulatory Visit: Payer: Commercial Managed Care - PPO

## 2022-04-27 ENCOUNTER — Other Ambulatory Visit: Payer: Self-pay

## 2022-04-27 DIAGNOSIS — Z8679 Personal history of other diseases of the circulatory system: Secondary | ICD-10-CM

## 2022-05-04 ENCOUNTER — Encounter (HOSPITAL_BASED_OUTPATIENT_CLINIC_OR_DEPARTMENT_OTHER): Payer: Self-pay | Admitting: Cardiology

## 2022-05-26 ENCOUNTER — Ambulatory Visit: Payer: Commercial Managed Care - PPO | Admitting: Family Medicine

## 2022-05-27 ENCOUNTER — Ambulatory Visit: Payer: Commercial Managed Care - PPO | Admitting: Family Medicine

## 2022-06-29 ENCOUNTER — Ambulatory Visit: Payer: Commercial Managed Care - PPO | Admitting: Family Medicine

## 2022-07-06 ENCOUNTER — Ambulatory Visit: Payer: Commercial Managed Care - PPO | Admitting: Family Medicine

## 2022-07-06 ENCOUNTER — Telehealth: Payer: Self-pay | Admitting: Family Medicine

## 2022-07-06 NOTE — Telephone Encounter (Signed)
Pt was a no show for a NP app with Dr Grandville Silos on 07/06/22, I sent a letter.

## 2022-07-13 NOTE — Telephone Encounter (Signed)
Canceled 1st new pt visit <24 hours notice No show 2nd new patient visit Dismissed from [place]

## 2022-10-21 ENCOUNTER — Other Ambulatory Visit: Payer: Self-pay | Admitting: *Deleted

## 2022-10-21 DIAGNOSIS — I714 Abdominal aortic aneurysm, without rupture, unspecified: Secondary | ICD-10-CM

## 2022-10-24 ENCOUNTER — Other Ambulatory Visit: Payer: Self-pay | Admitting: *Deleted

## 2022-10-24 DIAGNOSIS — I714 Abdominal aortic aneurysm, without rupture, unspecified: Secondary | ICD-10-CM

## 2022-10-31 ENCOUNTER — Ambulatory Visit (HOSPITAL_COMMUNITY): Payer: Self-pay

## 2022-10-31 ENCOUNTER — Ambulatory Visit: Payer: Commercial Managed Care - PPO

## 2022-12-01 NOTE — Progress Notes (Signed)
Office Note     CC:  follow up Requesting Provider:  Georganna Skeans, MD  HPI: Andre Morris is a 52 y.o. (1971-01-24) male who presents AAA and is s/p EVAR on 09/12/2019 by Dr. Myra Gianotti. He has done very well since his surgery without any associated back or abdominal pain.  The pt returns today for follow up non invasive studies. He says overall he is doing very well. He denies any back or abdominal pain. He does not have any pain on ambulation or rest in his legs, no tissue loss. He reports some numbness/ tingling in his right arm from time to time. 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 He use to drive all over the country but now is just driving locally. He reports trying to change his lifestyle- eating healthier and going to the gym regularly.    The pt is  on a statin for cholesterol management.    The pt is on an aspirin.    Other AC:  none The pt is not on medication for hypertension.  The pt does not have diabetes. Tobacco hx:  former  Past Medical History:  Diagnosis Date   AAA (abdominal aortic aneurysm) (HCC)    Bell's palsy 04/2016   GERD (gastroesophageal reflux disease)     Past Surgical History:  Procedure Laterality Date   ABDOMINAL AORTIC ENDOVASCULAR STENT GRAFT N/A 09/12/2019   Procedure: ABDOMINAL AORTIC ENDOVASCULAR STENT GRAFT;  Surgeon: Nada Libman, MD;  Location: [facility] OR;  Service: Vascular;  Laterality: N/A;   EMBOLIZATION Left 09/12/2019   Procedure: Coil embolization of left internal iliac artery;  Surgeon: Nada Libman, MD;  Location: [facility] OR;  Service: Vascular;  Laterality: Left;   ULTRASOUND GUIDANCE FOR VASCULAR ACCESS  09/12/2019   Procedure: Ultrasound Guidance For Vascular Access;  Surgeon: Nada Libman, MD;  Location: [facility] OR;  Service: Vascular;;    Social History   Socioeconomic History   Marital status: Divorced    Spouse name: Not on file   Number of children: 4   Years of education: 12    Highest education level: Not on file  Occupational History   Occupation: OTR Truck Driver  Tobacco Use   Smoking status: Former    Packs/day: 0.00    Years: 20.00    Additional pack years: 0.00    Total pack years: 0.00    Types: Cigarettes    Passive exposure: Never   Smokeless tobacco: Never   Tobacco comments:    1- 2 cigs per week  Vaping Use   Vaping Use: Never used  Substance and Sexual Activity   Alcohol use: Yes    Alcohol/week: 3.0 standard drinks of alcohol    Types: 3 Standard drinks or equivalent per week    Comment: Occasionanally    Drug use: Yes    Frequency: 7.0 times per week    Types: Marijuana   Sexual activity: Not Currently  Other Topics Concern   Not on file  Social History Narrative   Fun: Go-carts, anything adventurous.    Denies religious beliefs effecting health care.    Social Determinants of Health   Financial Resource Strain: Not on file  Food Insecurity: Not on file  Transportation Needs: Not on file  Physical Activity: Not on file  Stress: Not on file  Social Connections: Not on file  Intimate Partner Violence: Not on file    Family History  Problem  Relation Age of Onset   Hypertension Mother    Lung cancer Father    Lung cancer Maternal Grandmother    Heart disease Maternal Grandmother    Hypertension Maternal Grandmother    Healthy Maternal Grandfather    Healthy Paternal Grandmother    Healthy Paternal Grandfather     Current Outpatient Medications  Medication Sig Dispense Refill   atorvastatin (LIPITOR) 10 MG tablet TAKE 1 TABLET(10 MG) BY MOUTH DAILY 90 tablet 3   aspirin EC 81 MG tablet Take 1 tablet (81 mg total) by mouth daily. Swallow whole. 30 tablet 12   No current facility-administered medications for this visit.    No Known Allergies   REVIEW OF SYSTEMS:  [X]  denotes positive finding, [ ]  denotes negative finding Cardiac  Comments:  Chest pain or chest pressure:    Shortness of breath upon exertion:     Short of breath when lying flat:    Irregular heart rhythm:        Vascular    Pain in calf, thigh, or hip brought on by ambulation:    Pain in feet at night that wakes you up from your sleep:     Blood clot in your veins:    Leg swelling:         Pulmonary    Oxygen at home:    Productive cough:     Wheezing:         Neurologic    Sudden weakness in arms or legs:     Sudden numbness in arms or legs:     Sudden onset of difficulty speaking or slurred speech:    Temporary loss of vision in one eye:     Problems with dizziness:         Gastrointestinal    Blood in stool:     Vomited blood:         Genitourinary    Burning when urinating:     Blood in urine:        Psychiatric    Major depression:         Hematologic    Bleeding problems:    Problems with blood clotting too easily:        Skin    Rashes or ulcers:        Constitutional    Fever or chills:      PHYSICAL EXAMINATION:  Vitals:   12/05/22 0818  BP: 137/85  Pulse: 60  Temp: 98.6 F (37 C)  TempSrc: Temporal  SpO2: 95%  Weight: 213 lb (96.6 kg)    General:  WDWN in NAD; vital signs documented above Gait: Normal HENT: WNL, normocephalic Pulmonary: normal non-labored breathing , without wheezing Cardiac: regular HR Abdomen: soft, NT, no masses Vascular Exam/Pulses: 2+ radial, 2+ femoral and Dp pulses bilaterally Extremities: without ischemic changes, without Gangrene , without cellulitis; without open wounds;  Musculoskeletal: no muscle wasting or atrophy  Neurologic: A&O X 3 Psychiatric:  The pt has Normal affect.   Non-Invasive Vascular Imaging:   Endovascular Aortic Repair (EVAR):  +----------+----------------+-------------------+-------------------+           Diameter AP (cm)Diameter Trans (cm)Velocities (cm/sec)  +----------+----------------+-------------------+-------------------+  Aorta    6.37            6.92               46                    +----------+----------------+-------------------+-------------------+  Right  Limb1.85            1.71               84                   +----------+----------------+-------------------+-------------------+  Left Limb 1.53            1.40               113                  +----------+----------------+-------------------+-------------------+   Summary:  Abdominal Aorta: Patent endovascular aneurysm repair with no evidence of endoleak.    ASSESSMENT/PLAN:: 52 y.o. male here for follow up for AAA. He is s/p EVAR on 09/12/2019 by Dr. Myra Gianotti. He has done very well since his surgery without any associated back or abdominal pain. - Duplex today is stable. Patent EVAR with no evidence of endoleak - Continue Aspirin and Statin  - He knows to follow up sooner should he have any new back or abdominal pain - He will follow up in 1 year with repeat EVAR duplex   Graceann Congress, PA-C [facility] [phone]  Clinic MD:   Myra Gianotti

## 2022-12-05 ENCOUNTER — Telehealth: Payer: Self-pay | Admitting: Surgery

## 2022-12-05 ENCOUNTER — Ambulatory Visit: Payer: Commercial Managed Care - PPO | Admitting: Physician Assistant

## 2022-12-05 ENCOUNTER — Ambulatory Visit (HOSPITAL_COMMUNITY)
Admission: RE | Admit: 2022-12-05 | Discharge: 2022-12-05 | Disposition: A | Discharge status: Home / Self Care | Payer: Commercial Managed Care - PPO | Source: Ambulatory Visit | Attending: Surgery | Admitting: Surgery

## 2022-12-05 VITALS — BP 137/85 | HR 60 | Temp 98.6°F | Wt 213.0 lb

## 2022-12-05 DIAGNOSIS — I714 Abdominal aortic aneurysm, without rupture, unspecified: Secondary | ICD-10-CM

## 2022-12-14 ENCOUNTER — Other Ambulatory Visit: Payer: Self-pay

## 2022-12-14 DIAGNOSIS — I7143 Infrarenal abdominal aortic aneurysm, without rupture: Secondary | ICD-10-CM

## 2023-01-09 ENCOUNTER — Ambulatory Visit: Payer: Commercial Managed Care - PPO | Admitting: Occupational Therapy

## 2023-01-09 NOTE — Therapy (Signed)
OUTPATIENT OCCUPATIONAL THERAPY NEURO EVALUATION  Patient Name: Andre Morris MRN: [id number] DOB:August 13, 1970, 52 y.o., male Today's Date: 01/10/2023  PCP: Filomena Jungling, NP  REFERRING PROVIDER: Filomena Jungling, NP  END OF SESSION:  OT End of Session - 01/10/23 1202     Visit Number 1    Number of Visits 1    Authorization Type [employer] - [id number]    OT Start Time 1100    OT Stop Time 1145    OT Time Calculation (min) 45 min    Activity Tolerance Patient tolerated treatment well    Behavior During Therapy WFL for tasks assessed/performed             Past Medical History:  Diagnosis Date   AAA (abdominal aortic aneurysm) (HCC)    Bell's palsy 04/2016   GERD (gastroesophageal reflux disease)    Past Surgical History:  Procedure Laterality Date   ABDOMINAL AORTIC ENDOVASCULAR STENT GRAFT N/A 09/12/2019   Procedure: ABDOMINAL AORTIC ENDOVASCULAR STENT GRAFT;  Surgeon: Nada Libman, MD;  Location: [facility] OR;  Service: Vascular;  Laterality: N/A;   EMBOLIZATION Left 09/12/2019   Procedure: Coil embolization of left internal iliac artery;  Surgeon: Nada Libman, MD;  Location: [facility] OR;  Service: Vascular;  Laterality: Left;   ULTRASOUND GUIDANCE FOR VASCULAR ACCESS  09/12/2019   Procedure: Ultrasound Guidance For Vascular Access;  Surgeon: Nada Libman, MD;  Location: [facility] OR;  Service: Vascular;;   Patient Active Problem List   Diagnosis Date Noted   Essential hypertension 01/20/2021   History of AAA (abdominal aortic aneurysm) repair 01/20/2021   Other chest pain 01/20/2021   Nonocclusive coronary atherosclerosis of native coronary artery 04/12/2020   Aortic root dilatation (HCC) 10/28/2019   Pure hypercholesterolemia 10/28/2019   Abdominal aortic aneurysm (AAA) (HCC) 09/12/2019   Abdominal aortic aneurysm (AAA) without rupture (HCC) 09/12/2019   Routine general medical examination at a health care facility 04/27/2015    ONSET DATE: 12/21/2022 (referral date)    REFERRING DIAG: R20.0,R20.2 (ICD-10-CM) - Numbness and tingling of right arm  THERAPY DIAG:  Other disturbances of skin sensation - Plan: Ot plan of care cert/re-cert  Rationale for Evaluation and Treatment: Rehabilitation  SUBJECTIVE:   SUBJECTIVE STATEMENT: It's not pain normally, but just a numbness and tingling down my Rt arm to my thumb. Pt reports it really hasn't affected his ability to use his arm. Symptoms have been present for about a year Pt accompanied by: self  PERTINENT HISTORY: HTN  PRECAUTIONS: None  WEIGHT BEARING RESTRICTIONS: No  PAIN:  Are you having pain? No and but occasional shooting pain along Rt arm (upper arm down radial side to thumb)  FALLS: Has patient fallen in last 6 months? No  LIVING ENVIRONMENT: Lives with: lives alone Lives in: House/apartment   PLOF: Independent, Vocation/Vocational requirements: full time truck driver, and Leisure: going to gym  PATIENT GOALS: get my arm better  OBJECTIVE:   HAND DOMINANCE: Right  ADLs: Overall ADLs: independent Transfers/ambulation related to ADLs: independent  IADLs: independent, no changes in writing   MOBILITY STATUS: Independent   UPPER EXTREMITY ROM:  BUE AROM WNL's   UPPER EXTREMITY MMT:   BUE MMT 5/5 in all muscle groups   HAND FUNCTION: Grip strength: Right: 139.3 lbs; Left: 110.2 lbs Intrinsics and thenar eminence intact - no wasting observed  COORDINATION: 9 Hole Peg test: Right: 23.69 sec; Left: 26 sec  SENSATION: Light touch: WFL Stereognosis: WFL Hot/Cold: WFL Just numbness and  tingling in arm to thumb  EDEMA: none   COGNITION: Overall cognitive status: Within functional limits for tasks assessed  VISION: Subjective report: denies change Baseline vision: No visual deficits  OBSERVATIONS: Pt reports occasional numbness/tingling in Rt upper arm down to thumb intermittently (not constant) but muscle groups appear intact and no muscle wasting noted. Pt  reports the symptoms do not effect his function or ability to use RUE, just an annoyance. Pt is working full time as Naval architect   TODAY'S TREATMENT:                                                                                                                              DATE: 01/10/23  Discussed positioning of BUE's and posture while driving truck and while sleeping. Provided handout on bed positioning and discussed positions to prevent any nerve compression. Also recommended taking a break approx. every 2 hours from driving and getting out and walking.   Pt instructed that if symptoms progress or become more constant, to get a referral for neurology consult and possible nerve conduction study  PATIENT EDUCATION: Education details: see above Person educated: Patient Education method: Explanation Education comprehension: verbalized understanding  HOME EXERCISE PROGRAM: N/A   GOALS:  Goals reviewed with patient? Yes   LONG TERM GOALS: Target date: 01/10/23  Pt to verbalize understanding of positioning to reduce symptoms of numbness and what to do if symptoms worsen Baseline:  Goal status: MET at evaluation   ASSESSMENT:  CLINICAL IMPRESSION: Patient is a 52 y.o. male who was seen today for occupational therapy evaluation for numbness/tingling Rt arm. Pt has no other symptoms, no muscle wasting, and normal strength. Pt reports this does not effect ability to use RUE functionally and is able to do all functional tasks, ADLS, and working. Pt was educated today on positioning to hopefully prevent and/or decrease symptoms. No further O.T. needed at this time.  PERFORMANCE DEFICITS: in functional skills including sensation,  IMPAIRMENTS: are limiting patient from rest and sleep.   CO-MORBIDITIES: has no other co-morbidities that affects occupational performance. Patient will benefit from skilled OT to address above impairments and improve overall function.  MODIFICATION OR  ASSISTANCE TO COMPLETE EVALUATION: No modification of tasks or assist necessary to complete an evaluation.  OT OCCUPATIONAL PROFILE AND HISTORY: Problem focused assessment: Including review of records relating to presenting problem.  CLINICAL DECISION MAKING: LOW - limited treatment options, no task modification necessary   EVALUATION COMPLEXITY: Low    PLAN:  OT FREQUENCY: one time visit   RECOMMENDED OTHER SERVICES: None at this time. Pt instructed that if symptoms worsen or become more constant to get referral to neurology from PCP  CONSULTED AND AGREED WITH PLAN OF CARE: Patient  PLAN FOR NEXT SESSION: n/a - eval only   Sheran Lawless, OT 01/10/2023, 12:03 PM

## 2023-01-10 ENCOUNTER — Ambulatory Visit: Payer: Commercial Managed Care - PPO | Attending: Nurse Practitioner | Admitting: Occupational Therapy

## 2023-01-10 ENCOUNTER — Encounter: Payer: Self-pay | Admitting: Occupational Therapy

## 2023-01-10 DIAGNOSIS — R208 Other disturbances of skin sensation: Secondary | ICD-10-CM | POA: Diagnosis present

## 2023-03-31 ENCOUNTER — Other Ambulatory Visit: Payer: Self-pay | Admitting: Family Medicine

## 2023-03-31 DIAGNOSIS — Z1212 Encounter for screening for malignant neoplasm of rectum: Secondary | ICD-10-CM

## 2023-03-31 DIAGNOSIS — Z1211 Encounter for screening for malignant neoplasm of colon: Secondary | ICD-10-CM

## 2023-04-05 ENCOUNTER — Ambulatory Visit (HOSPITAL_BASED_OUTPATIENT_CLINIC_OR_DEPARTMENT_OTHER): Payer: Commercial Managed Care - PPO | Admitting: Cardiology

## 2023-04-05 ENCOUNTER — Encounter (HOSPITAL_BASED_OUTPATIENT_CLINIC_OR_DEPARTMENT_OTHER): Payer: Self-pay | Admitting: Cardiology

## 2023-04-05 VITALS — BP 124/72 | HR 77 | Ht 71.0 in | Wt 198.6 lb

## 2023-04-05 DIAGNOSIS — Z9889 Other specified postprocedural states: Secondary | ICD-10-CM

## 2023-04-05 DIAGNOSIS — Z7189 Other specified counseling: Secondary | ICD-10-CM

## 2023-04-05 DIAGNOSIS — I714 Abdominal aortic aneurysm, without rupture, unspecified: Secondary | ICD-10-CM

## 2023-04-05 DIAGNOSIS — I2510 Atherosclerotic heart disease of native coronary artery without angina pectoris: Secondary | ICD-10-CM | POA: Diagnosis not present

## 2023-04-05 DIAGNOSIS — E7800 Pure hypercholesterolemia, unspecified: Secondary | ICD-10-CM | POA: Diagnosis not present

## 2023-04-05 DIAGNOSIS — Z8679 Personal history of other diseases of the circulatory system: Secondary | ICD-10-CM

## 2023-04-05 NOTE — Patient Instructions (Addendum)
Medication Instructions:  Your physician recommends that you continue on your current medications as directed. Please refer to the Current Medication list given to you today.  *If you need a refill on your cardiac medications before your next appointment, please call your pharmacy*  Lab Work: NONE  Testing/Procedures: NONE  Follow-Up: At [facility], you and your health needs are our priority.  As part of our continuing mission to provide you with exceptional heart care, we have created designated Provider Care Teams.  These Care Teams include your primary Cardiologist (physician) and Advanced Practice Providers (APPs -  Physician Assistants and Nurse Practitioners) who all work together to provide you with the care you need, when you need it.  We recommend signing up for the patient portal called "MyChart".  Sign up information is provided on this After Visit Summary.  MyChart is used to connect with patients for Virtual Visits (Telemedicine).  Patients are able to view lab/test results, encounter notes, upcoming appointments, etc.  Non-urgent messages can be sent to your provider as well.   To learn more about what you can do with MyChart, go to [email or web address].    Your next appointment:   12 month(s)  The format for your next appointment:   In Person  Provider:   Jodelle Red, MD      Recent data, summarized from NPR from a study from the [facility]:  CropWizard.com.pt  "Researchers at the [facility] set out to answer this question by comparing statins to supplements in a clinical trial. They tracked the outcomes of 190 adults, ages 92 to 30. Some participants were given a 5 mg daily dose of rosuvastatin, a statin that is sold under the brand name Crestor for 28 days. Others were given supplements, including fish oil,  cinnamon, garlic, turmeric, plant sterols or red yeast rice for the same period."  "What we found was that rosuvastatin lowered LDL cholesterol by almost 86% and that was vastly superior to placebo and any of the six supplements studied in the trial," study Kayren Eaves, M.D. of the [facility] told NPR. He says this level of reduction is enough to lower the risk of heart attacks and strokes. The findings are published in the Journal of the [employer] of Cardiology.  "Oftentimes these supplements are marketed as 'natural ways' to lower your cholesterol," says Laffin. But he says none of the dietary supplements demonstrated any significant decrease in LDL cholesterol compared with a placebo. LDL cholesterol is considered the 'bad cholesterol' because it can contribute to plaque build-up in the artery walls - which can narrow the arteries, and set the stage for heart attacks and strokes"

## 2023-04-05 NOTE — Progress Notes (Signed)
Cardiology Office Note:  .    Date:  04/05/2023  ID:  Andre Morris, DOB Nov 01, 1970, MRN [id number] PCP: Georganna Skeans, MD  [facility] Providers Cardiologist:  Jodelle Red, MD     History of Present Illness: .    Andre Morris is a 52 y.o. male with a hx of AAA s/p EVAR, noted dilated aortic root, who is seen for follow up today. I initially met him 10/17/19 at the request of Dr. Myra Gianotti for dilated aortic root at the level of the sinuses.   Cardiovascular history: had endovascular repair for AAA on 09/12/19 with Dr. Myra Gianotti. Referred for dilated sinuses seen on CT cardiac. Confirmed with echo 10/2019 with only trivial AI.   Family history: maternal uncle with a history of aneurysm, diagnosed around age 31. Treated, did well but deceased from lung cancer. Maternal grandmother with heart failure. No heart attacks or strokes that they know of. Not much known about paternal side.   At his visit 03/2022, he complained of very rare minor chest pains, left arm paresthesia, and occasional dorsal neck soreness. Blood pressure was well controlled in the office, and not monitored at home. We discussed role of aspirin, which he agreed to initiate. EVAR duplex study 03/2022 with slightly increased AAA. Repeat study 11/2022 with stable AAA and patent EVAR with no evidence of endoleak.    Today, he states he is feeling alright. In the office his blood pressure is 124/72. In 08/2019, he had occasional chest pains for the rest of that year. However, in the past 6 months he denies any concerning recurrent symptoms.  For exercise he is routinely walking 2-4 miles, sometimes up to 10 miles. Goes to the gym and performs some weight lifting exercises, which is typically his most strenuous activity. No anginal symptoms, no shortness of breath, no syncopal episodes.  Currently he is not taking his 81 mg ASA or 10 mg atorvastatin. He is focusing on more natural herbs and supplements  including shilajit, cayenne pepper, elderberry.  He denies any palpitations, peripheral edema, lightheadedness, headaches, orthopnea, or PND.  ROS:  Please see the history of present illness. ROS otherwise negative except as noted.   Studies Reviewed: Marland Kitchen    EKG Interpretation Date/Time:  Wednesday April 05 2023 08:36:29 EDT Ventricular Rate:  77 PR Interval:  212 QRS Duration:  76 QT Interval:  380 QTC Calculation: 430 R Axis:   62  Text Interpretation: Sinus rhythm with 1st degree A-V block Minimal voltage criteria for LVH, may be normal variant Confirmed by Jodelle Red [phone]) on 04/05/2023 8:47:51 AM    EVAR Duplex  12/05/2022: Summary:  Abdominal Aorta: Patent endovascular aneurysm repair with no evidence of  endoleak.   Physical Exam:    VS:  BP 124/72   Pulse 77   Ht 5\' 11"  (1.803 m)   Wt 198 lb 9.6 oz (90.1 kg)   SpO2 97%   BMI 27.70 kg/m    Wt Readings from Last 3 Encounters:  04/05/23 198 lb 9.6 oz (90.1 kg)  12/05/22 213 lb (96.6 kg)  04/25/22 219 lb 12.8 oz (99.7 kg)    GEN: Well nourished, well developed in no acute distress HEENT: Normal, moist mucous membranes NECK: No JVD CARDIAC: regular rhythm, normal S1 and S2, no rubs or gallops. No murmur. VASCULAR: Radial and DP pulses 2+ bilaterally. No carotid bruits RESPIRATORY:  Clear to auscultation without rales, wheezing or rhonchi  ABDOMEN: Soft, non-tender, non-distended MUSCULOSKELETAL:  Ambulates independently SKIN:  Warm and dry, no edema NEUROLOGIC:  Alert and oriented x 3. No focal neuro deficits noted. PSYCHIATRIC:  Normal affect   ASSESSMENT AND PLAN: .    History of AAA s/p EVAR, found to have aortic root dilation/sinus of Valsalva dilation on CT: -echo, CTA as above -trivial AI -EVAR followed by vascular surgery -discussed importance of blood pressure control and CV risk modification -congratulated on tobacco cessation   Hyperlipidemia: With vascular disease and nonobstructive  CAD, LDL goal <70 -did not tolerate rosuvastatin in the past -trialed pravastatin, did not tolerate -was previously on aspirin and atorvastatin, stopped both. Discussed this today, recommend restarting both. Reviewed lack of data for supplements improving CV risk   Cardiac risk counseling and prevention recommendations: -recommend heart healthy/Mediterranean diet, with whole grains, fruits, vegetable, fish, lean meats, nuts, and olive oil. Limit salt. -recommend moderate walking, 3-5 times/week for 30-50 minutes each session. Aim for at least 150 minutes.week. Goal should be pace of 3 miles/hours, or walking 1.5 miles in 30 minutes -recommend avoidance of tobacco products. Avoid excess alcohol.  Dispo: Follow-up in 1 year, or sooner as needed.  I,Mathew Stumpf,acting as a Neurosurgeon for [employer], MD.,have documented all relevant documentation on the behalf of Jodelle Red, MD,as directed by  Jodelle Red, MD while in the presence of Jodelle Red, MD.  I, Jodelle Red, MD, have reviewed all documentation for this visit. The documentation on 05/03/23 for the exam, diagnosis, procedures, and orders are all accurate and complete.   Signed, Jodelle Red, MD

## 2023-04-28 LAB — COLOGUARD: COLOGUARD: NEGATIVE
